# Patient Record
Sex: Female | Born: 1975 | Race: White | Hispanic: Yes | Marital: Single | Smoking: Never smoker
Health system: Southern US, Community
[De-identification: ages and names within clinical notes are randomized; demographics above are authoritative.]

## PROBLEM LIST (undated history)

## (undated) DIAGNOSIS — I10 Essential (primary) hypertension: Secondary | ICD-10-CM

## (undated) DIAGNOSIS — E7800 Pure hypercholesterolemia, unspecified: Secondary | ICD-10-CM

## (undated) HISTORY — PX: NO PAST SURGERIES: SHX2092

## (undated) HISTORY — DX: Essential (primary) hypertension: I10

---

## 2009-03-27 ENCOUNTER — Emergency Department (HOSPITAL_COMMUNITY): Admission: EM | Admit: 2009-03-27 | Discharge: 2009-03-27 | Payer: Self-pay | Admitting: Emergency Medicine

## 2010-07-03 LAB — PREGNANCY, URINE

## 2011-06-20 ENCOUNTER — Encounter (HOSPITAL_COMMUNITY): Payer: Self-pay | Admitting: Emergency Medicine

## 2011-06-20 ENCOUNTER — Emergency Department (HOSPITAL_COMMUNITY)
Admission: EM | Admit: 2011-06-20 | Discharge: 2011-06-20 | Disposition: A | Discharge status: Home / Self Care | Payer: Self-pay | Attending: Emergency Medicine | Admitting: Emergency Medicine

## 2011-06-20 DIAGNOSIS — J069 Acute upper respiratory infection, unspecified: Secondary | ICD-10-CM

## 2011-06-20 DIAGNOSIS — R51 Headache: Secondary | ICD-10-CM | POA: Insufficient documentation

## 2011-06-20 DIAGNOSIS — J029 Acute pharyngitis, unspecified: Secondary | ICD-10-CM | POA: Insufficient documentation

## 2011-06-20 DIAGNOSIS — IMO0001 Reserved for inherently not codable concepts without codable children: Secondary | ICD-10-CM | POA: Insufficient documentation

## 2011-06-20 NOTE — ED Provider Notes (Signed)
History     CSN: [id number]  Arrival date & time 06/20/11  1656   First MD Initiated Contact with Patient 06/20/11 1859      Chief Complaint  Patient presents with  . Nasal Congestion  . Cough    (Consider location/radiation/quality/duration/timing/severity/associated sxs/prior treatment) Patient is a 36 y.o. female presenting with cough. The history is provided by the patient. No language interpreter was used.  Cough This is a recurrent problem. The current episode started more than 1 week ago. The problem has been gradually worsening. The cough is non-productive. The maximum temperature recorded prior to her arrival was 100 to 100.9 F. The fever has been present for 5 days or more. Associated symptoms include chills, headaches, rhinorrhea, sore throat and myalgias. Pertinent negatives include no chest pain, no sweats, no ear congestion, no ear pain, no shortness of breath and no wheezing. She is not a smoker. Her past medical history does not include bronchitis, pneumonia, COPD, emphysema or asthma.   States that her and her husband have been coughing and congested with headaches for 3 weeks.  The kids have had sore throats and tested positive for strep. States has had intermittent low grade fevers with body aches. Denies n/v/d. Looks non-toxic. Will check rapid strep.  History reviewed. No pertinent past medical history.  History reviewed. No pertinent past surgical history.  No family history on file.  History  Substance Use Topics  . Smoking status: Never Smoker   . Smokeless tobacco: Not on file  . Alcohol Use:     OB History    Grav Para Term Preterm Abortions TAB SAB Ect Mult Living                  Review of Systems  Constitutional: Positive for chills.  HENT: Positive for sore throat and rhinorrhea. Negative for ear pain, neck pain and neck stiffness.   Respiratory: Positive for cough. Negative for shortness of breath and wheezing.   Cardiovascular: Negative for  chest pain.  Gastrointestinal: Negative for nausea, vomiting and diarrhea.  Musculoskeletal: Positive for myalgias.  Neurological: Positive for headaches.    Allergies  Review of patient's allergies indicates no known allergies.  Home Medications   Current Outpatient Rx  Name Route Sig Dispense Refill  . ACETAMINOPHEN 325 MG PO TABS Oral Take 650 mg by mouth every 6 (six) hours as needed.      BP 112/71  Pulse 72  Temp(Src) 98.5 F (36.9 C) (Oral)  Ht 5\' 4"  (1.626 m)  Wt 193 lb (87.544 kg)  BMI 33.13 kg/m2  SpO2 100%  Physical Exam  Nursing note and vitals reviewed. Constitutional: She is oriented to person, place, and time. She appears well-developed and well-nourished.  HENT:  Head: Normocephalic and atraumatic.  Right Ear: Tympanic membrane normal.  Left Ear: Tympanic membrane normal.  Nose: Mucosal edema and rhinorrhea present.  Mouth/Throat: Uvula is midline and mucous membranes are normal. Posterior oropharyngeal erythema present. No oropharyngeal exudate, posterior oropharyngeal edema or tonsillar abscesses.  Eyes: Conjunctivae and EOM are normal. Pupils are equal, round, and reactive to light.  Neck: Normal range of motion. Neck supple.  Cardiovascular: Normal rate.   Pulmonary/Chest: Effort normal and breath sounds normal. No respiratory distress. She has no wheezes.  Abdominal: Soft. There is no tenderness.  Musculoskeletal: Normal range of motion. She exhibits no edema and no tenderness.  Neurological: She is alert and oriented to person, place, and time. She has normal reflexes.  Skin: Skin is  warm and dry.  Psychiatric: She has a normal mood and affect.    ED Course  Procedures (including critical care time)   Labs Reviewed  RAPID STREP SCREEN   No results found.   No diagnosis found.    MDM  Upper respiratory symptoms x 3 weeks with sick contacts including strep throat.  Strep negative in the ER today.  No pcp return if worse.  Non toxic  appearance. Ibuprofen for body aches and fever.  Benadryl for nasal congestion.  Plan discussed with patient and ready for discharge.          Jethro Bastos, NP 06/22/11 1147

## 2011-06-20 NOTE — ED Notes (Signed)
Pt states that she and her husband have had nasal congestion and cough with headache for 3 weeks. States that their kids tested positive for strep not too long ago. Their throats hurt also.

## 2011-06-20 NOTE — Discharge Instructions (Signed)
Katherine Trujillo your lungs sound clear today. Your strep test was negative today. Try using the saline spray in your nose to make it feel better.  Take ibuprofen for pain.  Take benadryl at night to help you sleep and help with post nasal drip that makes you cough.  Return to the ER if you have uncontrolled fever nausea and vomiting. Followup with your PCP of your choice or get one from the list below to followup with.  RESOURCE GUIDE  Dental Problems  Patients with Medicaid: [facility] [phone]                                           [phone] [address] Phone:  [phone]                                                  Phone:  [phone]  If unable to pay or uninsured, contact:  [facility] or [facility]. to become qualified for the adult dental clinic.  Chronic Pain Problems Contact [facility]  [phone] Patients need to be referred by their primary care doctor.  Insufficient Money for Medicine Contact [employer]:  call "211" or [facility] 787-545-6237.  No Primary Care Doctor Call [facility]  [phone] Other agencies that provide inexpensive medical care    [facility]  [phone]    [facility]  [phone]    [facility]  [phone]    [facility]  [phone]    [facility]  [phone]    [facility]  [phone]  Psychological Services [facility]  [phone] [facility] Services  [phone] [facility]   [phone] (emergency services [phone])  Substance Abuse Resources [facility]  [phone] [facility] [phone] [facility] [phone] Floydene Flock [phone] Residential & Outpatient Substance Abuse Program  [phone]  Abuse/Neglect [facility] Child Abuse Hotline 606-326-3158 [facility] Child Abuse Hotline (504)736-7958 (After  Hours)  Emergency Shelter [facility] Ministries [phone]  Maternity Homes [facility] [phone] [facility] [phone]  MRSA Hotline #:   332-184-6355    [facility] Resources  [facility]     [employer]                          [facility] Dept. [address] [place]                       [address]      [address]  Patrecia Pace  Michell Heinrich Phone:  [phone]                                   Phone:  [phone]                 Phone:  [phone]  [facility] Mental Health Phone:  [phone]  [facility] Child Abuse Hotline [phone] (After Hours)   Antibiotic Nonuse  Your caregiver felt that the infection or problem was not one that would be helped with an antibiotic. Infections may be caused by viruses or bacteria. Only a caregiver can tell which one of these is the likely cause of an illness. A cold is the most common cause of infection in both adults and children. A cold is a virus. Antibiotic treatment will have no effect on a viral infection. Viruses can lead to many lost days of work caring for sick children and many missed days of school. Children may catch as many as 10 "colds" or "flus" per year during which they can be tearful, cranky, and uncomfortable. The goal of treating a virus is aimed at keeping the ill person comfortable. Antibiotics are medications used to help the body fight bacterial infections. There are relatively few types of bacteria that cause infections but there are hundreds of viruses. While both viruses and bacteria cause infection they are very different types of germs. A viral infection will typically go away by itself within 7 to 10 days. Bacterial infections may spread or get worse without antibiotic treatment. Examples of bacterial  infections are:  Sore throats (like strep throat or tonsillitis).   Infection in the lung (pneumonia).   Ear and skin infections.  Examples of viral infections are:  Colds or flus.   Most coughs and bronchitis.   Sore throats not caused by Strep.   Runny noses.  It is often best not to take an antibiotic when a viral infection is the cause of the problem. Antibiotics can kill off the helpful bacteria that we have inside our body and allow harmful bacteria to start growing. Antibiotics can cause side effects such as allergies, nausea, and diarrhea without helping to improve the symptoms of the viral infection. Additionally, repeated uses of antibiotics can cause bacteria inside of our body to become resistant. That resistance can be passed onto harmful bacterial. The next time you have an infection it may be harder to treat if antibiotics are used when they are not needed. Not treating with antibiotics allows our own immune system to develop and take care of infections more efficiently. Also, antibiotics will work better for [place] when they are prescribed for bacterial infections. Treatments for a child that is ill may include:  Give extra fluids throughout the day to stay hydrated.   Get plenty of rest.   Only give your child over-the-counter or prescription medicines for pain, discomfort, or fever as directed by your caregiver.   The use of a cool mist humidifier may help stuffy noses.   Cold medications if suggested by your caregiver.  Your caregiver may decide to start you on an antibiotic if:  The problem you were seen for today continues for a longer length of time than expected.   You develop a secondary bacterial infection.  SEEK MEDICAL CARE IF:  Fever lasts longer than 5 days.   Symptoms continue to get worse after 5 to 7 days or become severe.  Difficulty in breathing develops.   Signs of dehydration develop (poor drinking, rare urinating, dark colored urine).    Changes in behavior or worsening tiredness (listlessness or lethargy).  Document Released: 05/28/2001 Document Revised: 03/08/2011 Document Reviewed: 11/24/2008 [facility] Patient Information 2012 [place], [place].Antibiotic Nonuse  Your caregiver felt that the infection or problem was not one that would be helped with an antibiotic. Infections may be caused by viruses or bacteria. Only a caregiver can tell which one of these is the likely cause of an illness. A cold is the most common cause of infection in both adults and children. A cold is a virus. Antibiotic treatment will have no effect on a viral infection. Viruses can lead to many lost days of work caring for sick children and many missed days of school. Children may catch as many as 10 "colds" or "flus" per year during which they can be tearful, cranky, and uncomfortable. The goal of treating a virus is aimed at keeping the ill person comfortable. Antibiotics are medications used to help the body fight bacterial infections. There are relatively few types of bacteria that cause infections but there are hundreds of viruses. While both viruses and bacteria cause infection they are very different types of germs. A viral infection will typically go away by itself within 7 to 10 days. Bacterial infections may spread or get worse without antibiotic treatment. Examples of bacterial infections are:  Sore throats (like strep throat or tonsillitis).   Infection in the lung (pneumonia).   Ear and skin infections.  Examples of viral infections are:  Colds or flus.   Most coughs and bronchitis.   Sore throats not caused by Strep.   Runny noses.  It is often best not to take an antibiotic when a viral infection is the cause of the problem. Antibiotics can kill off the helpful bacteria that we have inside our body and allow harmful bacteria to start growing. Antibiotics can cause side effects such as allergies, nausea, and diarrhea without helping to  improve the symptoms of the viral infection. Additionally, repeated uses of antibiotics can cause bacteria inside of our body to become resistant. That resistance can be passed onto harmful bacterial. The next time you have an infection it may be harder to treat if antibiotics are used when they are not needed. Not treating with antibiotics allows our own immune system to develop and take care of infections more efficiently. Also, antibiotics will work better for [place] when they are prescribed for bacterial infections. Treatments for a child that is ill may include:  Give extra fluids throughout the day to stay hydrated.   Get plenty of rest.   Only give your child over-the-counter or prescription medicines for pain, discomfort, or fever as directed by your caregiver.   The use of a cool mist humidifier may help stuffy noses.   Cold medications if suggested by your caregiver.  Your caregiver may decide to start you on an antibiotic if:  The problem you were seen for today continues for a longer length of time than expected.   You develop a secondary bacterial infection.  SEEK MEDICAL CARE IF:  Fever lasts longer than 5 days.   Symptoms continue to get worse after 5 to 7 days or become severe.   Difficulty in breathing develops.   Signs of dehydration develop (poor drinking, rare urinating, dark colored urine).   Changes in behavior or worsening tiredness (listlessness or lethargy).  Document Released: 05/28/2001 Document Revised: 03/08/2011 Document Reviewed:  11/24/2008 [employer] Patient Information 2012 [place], [place].Antibiotic Nonuse  Your caregiver felt that the infection or problem was not one that would be helped with an antibiotic. Infections may be caused by viruses or bacteria. Only a caregiver can tell which one of these is the likely cause of an illness. A cold is the most common cause of infection in both adults and children. A cold is a virus. Antibiotic treatment will have  no effect on a viral infection. Viruses can lead to many lost days of work caring for sick children and many missed days of school. Children may catch as many as 10 "colds" or "flus" per year during which they can be tearful, cranky, and uncomfortable. The goal of treating a virus is aimed at keeping the ill person comfortable. Antibiotics are medications used to help the body fight bacterial infections. There are relatively few types of bacteria that cause infections but there are hundreds of viruses. While both viruses and bacteria cause infection they are very different types of germs. A viral infection will typically go away by itself within 7 to 10 days. Bacterial infections may spread or get worse without antibiotic treatment. Examples of bacterial infections are:  Sore throats (like strep throat or tonsillitis).   Infection in the lung (pneumonia).   Ear and skin infections.  Examples of viral infections are:  Colds or flus.   Most coughs and bronchitis.   Sore throats not caused by Strep.   Runny noses.  It is often best not to take an antibiotic when a viral infection is the cause of the problem. Antibiotics can kill off the helpful bacteria that we have inside our body and allow harmful bacteria to start growing. Antibiotics can cause side effects such as allergies, nausea, and diarrhea without helping to improve the symptoms of the viral infection. Additionally, repeated uses of antibiotics can cause bacteria inside of our body to become resistant. That resistance can be passed onto harmful bacterial. The next time you have an infection it may be harder to treat if antibiotics are used when they are not needed. Not treating with antibiotics allows our own immune system to develop and take care of infections more efficiently. Also, antibiotics will work better for [place] when they are prescribed for bacterial infections. Treatments for a child that is ill may include:  Give extra fluids  throughout the day to stay hydrated.   Get plenty of rest.   Only give your child over-the-counter or prescription medicines for pain, discomfort, or fever as directed by your caregiver.   The use of a cool mist humidifier may help stuffy noses.   Cold medications if suggested by your caregiver.  Your caregiver may decide to start you on an antibiotic if:  The problem you were seen for today continues for a longer length of time than expected.   You develop a secondary bacterial infection.  SEEK MEDICAL CARE IF:  Fever lasts longer than 5 days.   Symptoms continue to get worse after 5 to 7 days or become severe.   Difficulty in breathing develops.   Signs of dehydration develop (poor drinking, rare urinating, dark colored urine).   Changes in behavior or worsening tiredness (listlessness or lethargy).  Document Released: 05/28/2001 Document Revised: 03/08/2011 Document Reviewed: 11/24/2008 [facility] Patient Information 2012 [place], [place].Saline Nose Drops  To help clear a stuffy nose, put salt water (saline) nose drops in your infant's nose. This helps to loosen the secretions in the nose. Use a  bulb syringe to clean the nose out:  Before feeding.   Before putting your infant down for naps.   No more than once every 3 hours to avoid irritating your infant's nostrils.  HOME CARE  Buy nose drops at your local drug store. You can also make nose drops yourself. Mix 1 cup of water with  teaspoon of salt. Stir. Store this mixture at room temperature. Make a new batch daily.   To use the drops:   Put 1 or 2 drops in each side of infant's nose with a clean medicine dropper. Do not use this dropper for any other medicine.   Squeeze the air out of the suction bulb before inserting it into your infant's nose.   While still squeezing the bulb flat, place the tip of the bulb into a nostril. Let air come back into the bulb. The suction will pull snot out of the nose and into the bulb.    Repeat on other nostril.   Squeeze the bulb several times into a tissue and wash the bulb tip in soapy water. Store the bulb with the tip side down on paper towel.   Use the bulb syringe with only the saline drops to avoid irritating your infant's nostrils.  GET HELP RIGHT AWAY IF:  The snot changes to green or yellow.   The snot gets thicker.   Your infant is 3 months or younger with a rectal temperature of 100.4 F (38 C) or higher.   Your infant is older than 3 months with a rectal temperature of 102 F (38.9 C) or higher.   The stuffy nose lasts 10 days or longer.   There is trouble breathing or feeding.  MAKE SURE YOU:  Understand these instructions.   Will watch your infant's condition.   Will get help right away if your infant is not doing well or gets worse.  Document Released: 01/14/2009 Document Revised: 03/08/2011 Document Reviewed: 01/14/2009 [facility] Patient Information 2012 [place], [place].Saline Nose Drops  To help clear a stuffy nose, put salt water (saline) nose drops in your infant's nose. This helps to loosen the secretions in the nose. Use a bulb syringe to clean the nose out:  Before feeding.   Before putting your infant down for naps.   No more than once every 3 hours to avoid irritating your infant's nostrils.  HOME CARE  Buy nose drops at your local drug store. You can also make nose drops yourself. Mix 1 cup of water with  teaspoon of salt. Stir. Store this mixture at room temperature. Make a new batch daily.   To use the drops:   Put 1 or 2 drops in each side of infant's nose with a clean medicine dropper. Do not use this dropper for any other medicine.   Squeeze the air out of the suction bulb before inserting it into your infant's nose.   While still squeezing the bulb flat, place the tip of the bulb into a nostril. Let air come back into the bulb. The suction will pull snot out of the nose and into the bulb.   Repeat on other nostril.     Squeeze the bulb several times into a tissue and wash the bulb tip in soapy water. Store the bulb with the tip side down on paper towel.   Use the bulb syringe with only the saline drops to avoid irritating your infant's nostrils.  GET HELP RIGHT AWAY IF:  The snot changes to green or  yellow.   The snot gets thicker.   Your infant is 3 months or younger with a rectal temperature of 100.4 F (38 C) or higher.   Your infant is older than 3 months with a rectal temperature of 102 F (38.9 C) or higher.   The stuffy nose lasts 10 days or longer.   There is trouble breathing or feeding.  MAKE SURE YOU:  Understand these instructions.   Will watch your infant's condition.   Will get help right away if your infant is not doing well or gets worse.  Document Released: 01/14/2009 Document Revised: 03/08/2011 Document Reviewed: 01/14/2009 [facility] Patient Information 2012 [place], [place].Saline Nose Drops  To help clear a stuffy nose, put salt water (saline) nose drops in your infant's nose. This helps to loosen the secretions in the nose. Use a bulb syringe to clean the nose out:  Before feeding.   Before putting your infant down for naps.   No more than once every 3 hours to avoid irritating your infant's nostrils.  HOME CARE  Buy nose drops at your local drug store. You can also make nose drops yourself. Mix 1 cup of water with  teaspoon of salt. Stir. Store this mixture at room temperature. Make a new batch daily.   To use the drops:   Put 1 or 2 drops in each side of infant's nose with a clean medicine dropper. Do not use this dropper for any other medicine.   Squeeze the air out of the suction bulb before inserting it into your infant's nose.   While still squeezing the bulb flat, place the tip of the bulb into a nostril. Let air come back into the bulb. The suction will pull snot out of the nose and into the bulb.   Repeat on other nostril.   Squeeze the bulb several  times into a tissue and wash the bulb tip in soapy water. Store the bulb with the tip side down on paper towel.   Use the bulb syringe with only the saline drops to avoid irritating your infant's nostrils.  GET HELP RIGHT AWAY IF:  The snot changes to green or yellow.   The snot gets thicker.   Your infant is 3 months or younger with a rectal temperature of 100.4 F (38 C) or higher.   Your infant is older than 3 months with a rectal temperature of 102 F (38.9 C) or higher.   The stuffy nose lasts 10 days or longer.   There is trouble breathing or feeding.  MAKE SURE YOU:  Understand these instructions.   Will watch your infant's condition.   Will get help right away if your infant is not doing well or gets worse.  Document Released: 01/14/2009 Document Revised: 03/08/2011 Document Reviewed: 01/14/2009 [facility] Patient Information 2012 [place], [place].Saline Nose Drops  To help clear a stuffy nose, put salt water (saline) nose drops in your infant's nose. This helps to loosen the secretions in the nose. Use a bulb syringe to clean the nose out:  Before feeding.   Before putting your infant down for naps.   No more than once every 3 hours to avoid irritating your infant's nostrils.  HOME CARE  Buy nose drops at your local drug store. You can also make nose drops yourself. Mix 1 cup of water with  teaspoon of salt. Stir. Store this mixture at room temperature. Make a new batch daily.   To use the drops:   Put 1 or  2 drops in each side of infant's nose with a clean medicine dropper. Do not use this dropper for any other medicine.   Squeeze the air out of the suction bulb before inserting it into your infant's nose.   While still squeezing the bulb flat, place the tip of the bulb into a nostril. Let air come back into the bulb. The suction will pull snot out of the nose and into the bulb.   Repeat on other nostril.   Squeeze the bulb several times into a tissue and wash  the bulb tip in soapy water. Store the bulb with the tip side down on paper towel.   Use the bulb syringe with only the saline drops to avoid irritating your infant's nostrils.  GET HELP RIGHT AWAY IF:  The snot changes to green or yellow.   The snot gets thicker.   Your infant is 3 months or younger with a rectal temperature of 100.4 F (38 C) or higher.   Your infant is older than 3 months with a rectal temperature of 102 F (38.9 C) or higher.   The stuffy nose lasts 10 days or longer.   There is trouble breathing or feeding.  MAKE SURE YOU:  Understand these instructions.   Will watch your infant's condition.   Will get help right away if your infant is not doing well or gets worse.  Document Released: 01/14/2009 Document Revised: 03/08/2011 Document Reviewed: 01/14/2009 [facility] Patient Information 2012 [place], [place].

## 2011-06-25 NOTE — ED Provider Notes (Signed)
Medical screening examination/treatment/procedure(s) were performed by non-physician practitioner and as supervising physician I was immediately available for consultation/collaboration.  Raidon Swanner, MD 06/25/11 0716 

## 2011-07-13 ENCOUNTER — Encounter (HOSPITAL_COMMUNITY): Payer: Self-pay | Admitting: Emergency Medicine

## 2011-07-13 ENCOUNTER — Emergency Department (HOSPITAL_COMMUNITY): Payer: Self-pay

## 2011-07-13 ENCOUNTER — Emergency Department (HOSPITAL_COMMUNITY)
Admission: EM | Admit: 2011-07-13 | Discharge: 2011-07-13 | Disposition: A | Discharge status: Home / Self Care | Payer: Self-pay | Attending: Emergency Medicine | Admitting: Emergency Medicine

## 2011-07-13 DIAGNOSIS — J189 Pneumonia, unspecified organism: Secondary | ICD-10-CM | POA: Insufficient documentation

## 2011-07-13 DIAGNOSIS — R059 Cough, unspecified: Secondary | ICD-10-CM | POA: Insufficient documentation

## 2011-07-13 DIAGNOSIS — R05 Cough: Secondary | ICD-10-CM | POA: Insufficient documentation

## 2011-07-13 DIAGNOSIS — R509 Fever, unspecified: Secondary | ICD-10-CM | POA: Insufficient documentation

## 2011-07-13 DIAGNOSIS — R0602 Shortness of breath: Secondary | ICD-10-CM | POA: Insufficient documentation

## 2011-07-13 MED ORDER — MOXIFLOXACIN HCL IN NACL 400 MG/250ML IV SOLN
400.0000 mg | Freq: Once | INTRAVENOUS | Status: AC
  Administered 2011-07-13: 400 mg via INTRAVENOUS
  Filled 2011-07-13: qty 250

## 2011-07-13 MED ORDER — ALBUTEROL SULFATE HFA 108 (90 BASE) MCG/ACT IN AERS
2.0000 | INHALATION_SPRAY | RESPIRATORY_TRACT | Status: DC | PRN
  Filled 2011-07-13: qty 6.7

## 2011-07-13 MED ORDER — AZITHROMYCIN 250 MG PO TABS: 250.0000 mg | ORAL_TABLET | Freq: Every day | ORAL | Status: AC

## 2011-07-13 MED ORDER — IBUPROFEN 800 MG PO TABS
ORAL_TABLET | ORAL | Status: AC
  Administered 2011-07-13: 800 mg via ORAL
  Filled 2011-07-13: qty 1

## 2011-07-13 MED ORDER — IBUPROFEN 800 MG PO TABS
800.0000 mg | ORAL_TABLET | Freq: Once | ORAL | Status: AC
  Administered 2011-07-13: 800 mg via ORAL

## 2011-07-13 MED ORDER — AZITHROMYCIN 250 MG PO TABS
500.0000 mg | ORAL_TABLET | Freq: Once | ORAL | Status: AC
  Administered 2011-07-13: 500 mg via ORAL
  Filled 2011-07-13: qty 2

## 2011-07-13 MED ORDER — ACETAMINOPHEN 325 MG PO TABS
650.0000 mg | ORAL_TABLET | Freq: Once | ORAL | Status: AC
  Administered 2011-07-13: 650 mg via ORAL
  Filled 2011-07-13: qty 2

## 2011-07-13 MED ORDER — ACETAMINOPHEN-CODEINE 120-12 MG/5ML PO SUSP: 5.0000 mL | Freq: Four times a day (QID) | ORAL | Status: AC | PRN

## 2011-07-13 NOTE — ED Provider Notes (Signed)
History     CSN: [id number]  Arrival date & time 07/13/11  0044   First MD Initiated Contact with Patient 07/13/11 0256      Chief Complaint  Patient presents with  . Cough  . Shortness of Breath    (Consider location/radiation/quality/duration/timing/severity/associated sxs/prior treatment) HPI Persistent cough over the last few weeks, now with fever tonight. Mild productive sputum. No hemoptysis. No leg pain or leg swelling. No nausea vomiting or diarrhea. No known sick contacts. No headaches or neck pain or stiffness. No sore throat. Moderate in severity. No pain or radiation  History reviewed. No pertinent past medical history.  History reviewed. No pertinent past surgical history.  No family history on file.  History  Substance Use Topics  . Smoking status: Never Smoker   . Smokeless tobacco: Not on file  . Alcohol Use:     OB History    Grav Para Term Preterm Abortions TAB SAB Ect Mult Living                  Review of Systems  Constitutional: Positive for fever and chills.  HENT: Negative for neck pain and neck stiffness.   Eyes: Negative for pain.  Respiratory: Positive for cough. Negative for wheezing.   Cardiovascular: Negative for chest pain.  Gastrointestinal: Negative for abdominal pain.  Genitourinary: Negative for dysuria.  Musculoskeletal: Negative for back pain.  Skin: Negative for rash.  Neurological: Negative for headaches.  All other systems reviewed and are negative.    Allergies  Review of patient's allergies indicates no known allergies.  Home Medications   Current Outpatient Rx  Name Route Sig Dispense Refill  . ACETAMINOPHEN 325 MG PO TABS Oral Take 650 mg by mouth every 6 (six) hours as needed.      BP 112/80  Pulse 93  Temp(Src) 102.6 F (39.2 C) (Oral)  Resp 16  Wt 193 lb (87.544 kg)  SpO2 99%  LMP 07/13/2011  Physical Exam  Constitutional: She is oriented to person, place, and time. She appears well-developed and  well-nourished.  HENT:  Head: Normocephalic and atraumatic.  Eyes: Conjunctivae and EOM are normal. Pupils are equal, round, and reactive to light.  Neck: Trachea normal. Neck supple. No thyromegaly present.  Cardiovascular: Normal rate, regular rhythm, S1 normal, S2 normal and normal pulses.     No systolic murmur is present   No diastolic murmur is present  Pulses:      Radial pulses are 2+ on the right side, and 2+ on the left side.  Pulmonary/Chest: She has no wheezes. She has no rhonchi. She has no rales. She exhibits no tenderness.       Mildly coarse bilateral breath sounds without tachypnea  Abdominal: Soft. Normal appearance and bowel sounds are normal. There is no tenderness. There is no CVA tenderness and negative Murphy's sign.  Musculoskeletal:       BLE:s Calves nontender, no cords or erythema, negative Homans sign  Neurological: She is alert and oriented to person, place, and time. She has normal strength. No cranial nerve deficit or sensory deficit. GCS eye subscore is 4. GCS verbal subscore is 5. GCS motor subscore is 6.  Skin: Skin is warm and dry. No rash noted. She is not diaphoretic.  Psychiatric: Her speech is normal.       Cooperative and appropriate    ED Course  Procedures (including critical care time)  Labs Reviewed - No data to display Dg Chest 2 View  07/13/2011  *  RADIOLOGY REPORT*  Clinical Data: Cough and fever for 3 days.  CHEST - 2 VIEW  Comparison: None.  Findings: Right middle lobe and possible medial segment left lower lobe infiltrate. Recommend follow-up until clearance.  Increased interstitial markings/pulmonary vascular congestion.  Heart size within normal limits.  No pneumothorax.  IMPRESSION: Right middle lobe and possible medial segment left lower lobe infiltrate. Recommend follow-up until clearance.  Increased interstitial markings/pulmonary vascular congestion.  Original Report Authenticated By: Fuller Canada, M.D.     MDM   Cough and  fever with pneumonia on x-ray. Treated with antibiotics. First dose given IV and prescription provided. Reliable historian and agrees to all discharge and followup instructions and strict return precautions for any worsening condition. No hypoxia or respiratory distress. Otherwise healthy adult stable for discharge home.         Sunnie Nielsen, MD 07/13/11 [phone]

## 2011-07-13 NOTE — ED Notes (Signed)
Pt alert, nad, c/o cough and congestion, onset a few days ago, recent exposure to ill child, resp even unlabored, npc noted

## 2011-07-13 NOTE — Discharge Instructions (Signed)

## 2013-01-19 ENCOUNTER — Emergency Department (HOSPITAL_COMMUNITY): Payer: Self-pay

## 2013-01-19 ENCOUNTER — Encounter (HOSPITAL_COMMUNITY): Payer: Self-pay | Admitting: Emergency Medicine

## 2013-01-19 ENCOUNTER — Emergency Department (HOSPITAL_COMMUNITY)
Admission: EM | Admit: 2013-01-19 | Discharge: 2013-01-19 | Disposition: A | Discharge status: Home / Self Care | Payer: Self-pay | Attending: Emergency Medicine | Admitting: Emergency Medicine

## 2013-01-19 DIAGNOSIS — J3489 Other specified disorders of nose and nasal sinuses: Secondary | ICD-10-CM | POA: Insufficient documentation

## 2013-01-19 DIAGNOSIS — N898 Other specified noninflammatory disorders of vagina: Secondary | ICD-10-CM | POA: Insufficient documentation

## 2013-01-19 DIAGNOSIS — R5381 Other malaise: Secondary | ICD-10-CM | POA: Insufficient documentation

## 2013-01-19 DIAGNOSIS — R51 Headache: Secondary | ICD-10-CM | POA: Insufficient documentation

## 2013-01-19 DIAGNOSIS — R109 Unspecified abdominal pain: Secondary | ICD-10-CM | POA: Insufficient documentation

## 2013-01-19 DIAGNOSIS — J029 Acute pharyngitis, unspecified: Secondary | ICD-10-CM | POA: Insufficient documentation

## 2013-01-19 DIAGNOSIS — R52 Pain, unspecified: Secondary | ICD-10-CM | POA: Insufficient documentation

## 2013-01-19 DIAGNOSIS — Z3202 Encounter for pregnancy test, result negative: Secondary | ICD-10-CM | POA: Insufficient documentation

## 2013-01-19 DIAGNOSIS — R110 Nausea: Secondary | ICD-10-CM | POA: Insufficient documentation

## 2013-01-19 DIAGNOSIS — R509 Fever, unspecified: Secondary | ICD-10-CM | POA: Insufficient documentation

## 2013-01-19 LAB — URINALYSIS, ROUTINE W REFLEX MICROSCOPIC
Bilirubin Urine: NEGATIVE
Ketones, ur: NEGATIVE mg/dL
Nitrite: NEGATIVE
Protein, ur: NEGATIVE mg/dL
Urobilinogen, UA: 1 mg/dL (ref 0.0–1.0)

## 2013-01-19 LAB — COMPREHENSIVE METABOLIC PANEL
ALT: 12 U/L (ref 0–35)
Alkaline Phosphatase: 69 U/L (ref 39–117)
BUN: 17 mg/dL (ref 6–23)
CO2: 24 mEq/L (ref 19–32)
GFR calc Af Amer: >90 mL/min (ref >90)
GFR calc non Af Amer: >90 mL/min (ref >90)
Glucose, Bld: 127 mg/dL — ABNORMAL HIGH (ref 70–99)
Potassium: 3.6 mEq/L (ref 3.5–5.1)
Sodium: 135 mEq/L (ref 135–145)
Total Bilirubin: 0.2 mg/dL — ABNORMAL LOW (ref 0.3–1.2)

## 2013-01-19 LAB — CBC WITH DIFFERENTIAL/PLATELET
Hemoglobin: 15 g/dL (ref 12.0–15.0)
Lymphocytes Relative: 14 % (ref 12–46)
Lymphs Abs: 1 10*3/uL (ref 0.7–4.0)
MCH: 30.9 pg (ref 26.0–34.0)
MCV: 87.7 fL (ref 78.0–100.0)
Monocytes Relative: 8 % (ref 3–12)
Neutrophils Relative %: 75 % (ref 43–77)
Platelets: 213 10*3/uL (ref 150–400)
RBC: 4.86 MIL/uL (ref 3.87–5.11)
WBC: 7.5 10*3/uL (ref 4.0–10.5)

## 2013-01-19 LAB — URINE MICROSCOPIC-ADD ON

## 2013-01-19 MED ORDER — KETOROLAC TROMETHAMINE 30 MG/ML IJ SOLN
30.0000 mg | Freq: Once | INTRAMUSCULAR | Status: AC
  Administered 2013-01-19: 30 mg via INTRAVENOUS
  Filled 2013-01-19: qty 1

## 2013-01-19 MED ORDER — OXYMETAZOLINE HCL 0.05 % NA SOLN: 2.0000 | Freq: Two times a day (BID) | NASAL | Status: DC

## 2013-01-19 MED ORDER — METOCLOPRAMIDE HCL 5 MG/ML IJ SOLN
10.0000 mg | Freq: Once | INTRAMUSCULAR | Status: AC
  Administered 2013-01-19: 10 mg via INTRAVENOUS
  Filled 2013-01-19: qty 2

## 2013-01-19 MED ORDER — ACETAMINOPHEN 325 MG PO TABS
650.0000 mg | ORAL_TABLET | Freq: Once | ORAL | Status: AC
  Administered 2013-01-19: 650 mg via ORAL
  Filled 2013-01-19: qty 2

## 2013-01-19 MED ORDER — SODIUM CHLORIDE 0.9 % IV BOLUS (SEPSIS)
1000.0000 mL | Freq: Once | INTRAVENOUS | Status: AC
  Administered 2013-01-19: 1000 mL via INTRAVENOUS

## 2013-01-19 MED ORDER — METOCLOPRAMIDE HCL 10 MG PO TABS: 10.0000 mg | ORAL_TABLET | Freq: Four times a day (QID) | ORAL | Status: DC | PRN

## 2013-01-19 MED ORDER — IBUPROFEN 600 MG PO TABS: 600.0000 mg | ORAL_TABLET | Freq: Four times a day (QID) | ORAL | Status: DC | PRN

## 2013-01-19 MED ORDER — MOMETASONE FUROATE 50 MCG/ACT NA SUSP: 2.0000 | Freq: Every day | NASAL | Status: DC

## 2013-01-19 NOTE — ED Provider Notes (Signed)
CSN: [id number]     Arrival date & time 01/19/13  0157 History   First MD Initiated Contact with Patient 01/19/13 [phone]     Chief Complaint  Patient presents with  . Generalized Body Aches  . Headache   (Consider location/radiation/quality/duration/timing/severity/associated sxs/prior Treatment) HPI History presents with 2-3 days of gradual onset frontal headache, nasal congestion, abdominal pain, nausea and low-grade fever with chills. Patient also is having diffuse myalgias and arthralgias and admits to mild cough without sputum production. She has no neck pain or stiffness. Just mild sore throat. Patient states she has 2 young children but had similar respiratory illness this several weeks ago. She's had no recent travel. Abdominal pain does not to be associated with food. She's never had any abdominal surgeries. States she is currently on her menstrual cycle. Abdominal pain is in the right upper quadrant and seems to radiate around to her right flank. She denies any., Dysuria, frequency or other urinary symptoms. History reviewed. No pertinent past medical history. History reviewed. No pertinent past surgical history. No family history on file. History  Substance Use Topics  . Smoking status: Never Smoker   . Smokeless tobacco: Not on file  . Alcohol Use: No   OB History   Grav Para Term Preterm Abortions TAB SAB Ect Mult Living                 Review of Systems  Constitutional: Positive for fever, chills and fatigue.  HENT: Positive for congestion, sinus pressure and sore throat. Negative for trouble swallowing.   Respiratory: Positive for cough. Negative for shortness of breath.   Cardiovascular: Negative for chest pain, palpitations and leg swelling.  Gastrointestinal: Positive for nausea and abdominal pain. Negative for vomiting, diarrhea and blood in stool.  Genitourinary: Positive for flank pain and vaginal bleeding. Negative for dysuria, frequency, hematuria, vaginal  discharge, difficulty urinating, vaginal pain and pelvic pain.  Musculoskeletal: Positive for back pain and myalgias. Negative for neck pain and neck stiffness.  Skin: Negative for rash and wound.  Neurological: Positive for headaches. Negative for dizziness, weakness, light-headedness and numbness.  All other systems reviewed and are negative.    Allergies  Review of patient's allergies indicates no known allergies.  Home Medications   Current Outpatient Rx  Name  Route  Sig  Dispense  Refill  . acetaminophen (TYLENOL) 325 MG tablet   Oral   Take 650 mg by mouth every 6 (six) hours as needed.          BP 118/88  Pulse 94  Temp(Src) 100.4 F (38 C) (Oral)  Resp 15  Ht 5\' 5"  (1.651 m)  Wt 185 lb (83.915 kg)  BMI 30.79 kg/m2  SpO2 96%  LMP 01/18/2013 Physical Exam  Nursing note and vitals reviewed. Constitutional: She is oriented to person, place, and time. She appears well-developed and well-nourished. No distress.  HENT:  Head: Normocephalic and atraumatic.  Mouth/Throat: Oropharynx is clear and moist. No oropharyngeal exudate.  Bilateral nasal mucosa edema. Patient has tenderness to percussion over bilateral maxillary sinuses.  Eyes: EOM are normal. Pupils are equal, round, and reactive to light.  Neck: Normal range of motion. Neck supple.  No meningismus  Cardiovascular: Normal rate and regular rhythm.   Pulmonary/Chest: Effort normal and breath sounds normal. No respiratory distress. She has no wheezes. She has no rales.  Abdominal: Soft. Bowel sounds are normal. She exhibits no distension and no mass. There is tenderness (tenderness to palpation the right upper quadrant.).  There is no rebound and no guarding.  Musculoskeletal: Normal range of motion. She exhibits no edema and no tenderness.  Mild right flank tenderness to percussion.  Neurological: She is alert and oriented to person, place, and time.  Moves all extremities without deficit. Sensation grossly  intact.  Skin: Skin is warm and dry. No rash noted. No erythema.  Psychiatric: She has a normal mood and affect. Her behavior is normal.    ED Course  Procedures (including critical care time) Labs Review Labs Reviewed  CBC WITH DIFFERENTIAL  COMPREHENSIVE METABOLIC PANEL  URINALYSIS, ROUTINE W REFLEX MICROSCOPIC  PREGNANCY, URINE  LIPASE, BLOOD   Imaging Review No results found.  EKG Interpretation   None       MDM  Question URI versus intra-abdominal infectious process.  Vision feeling much better after IV fluids and pain medication. I suspect she has an influenza-like illness. There is no evidence for bacterial infection. Abdominal ultrasound is normal. Her headache I believe is related to her viral infection. She has no concerning symptoms or signs of meningitis, encephalitis or subarachnoid hemorrhage. We'll treat symptomatically and advised plenty of fluids and rest. Return precautions have been given.  Loren Racer, MD 01/19/13 [phone]

## 2013-01-19 NOTE — ED Notes (Addendum)
Pt states she hurts all over, started 2 days ago with headache, then body aches and chills, been taking OTC meds without relief, nausea without vomiting

## 2015-07-11 ENCOUNTER — Emergency Department (HOSPITAL_COMMUNITY)
Admission: EM | Admit: 2015-07-11 | Discharge: 2015-07-11 | Disposition: A | Discharge status: Home / Self Care | Payer: Self-pay | Attending: Emergency Medicine | Admitting: Emergency Medicine

## 2015-07-11 ENCOUNTER — Encounter (HOSPITAL_COMMUNITY): Payer: Self-pay | Admitting: Emergency Medicine

## 2015-07-11 ENCOUNTER — Emergency Department (HOSPITAL_BASED_OUTPATIENT_CLINIC_OR_DEPARTMENT_OTHER): Admit: 2015-07-11 | Discharge: 2015-07-11 | Disposition: A | Discharge status: Home / Self Care | Payer: Self-pay

## 2015-07-11 DIAGNOSIS — M25551 Pain in right hip: Secondary | ICD-10-CM | POA: Insufficient documentation

## 2015-07-11 DIAGNOSIS — Z7951 Long term (current) use of inhaled steroids: Secondary | ICD-10-CM | POA: Insufficient documentation

## 2015-07-11 DIAGNOSIS — M79609 Pain in unspecified limb: Secondary | ICD-10-CM

## 2015-07-11 DIAGNOSIS — M79661 Pain in right lower leg: Secondary | ICD-10-CM | POA: Insufficient documentation

## 2015-07-11 DIAGNOSIS — M79604 Pain in right leg: Secondary | ICD-10-CM

## 2015-07-11 DIAGNOSIS — Z79899 Other long term (current) drug therapy: Secondary | ICD-10-CM | POA: Insufficient documentation

## 2015-07-11 MED ORDER — KETOROLAC TROMETHAMINE 30 MG/ML IJ SOLN
30.0000 mg | Freq: Once | INTRAMUSCULAR | Status: AC
  Administered 2015-07-11: 30 mg via INTRAMUSCULAR
  Filled 2015-07-11 (×2): qty 1

## 2015-07-11 MED ORDER — NAPROXEN 500 MG PO TABS: 500.0000 mg | ORAL_TABLET | Freq: Two times a day (BID) | ORAL | Status: DC

## 2015-07-11 NOTE — ED Notes (Signed)
RN contacted US.  [facility] [place].

## 2015-07-11 NOTE — Progress Notes (Signed)
*  Preliminary Results* Right lower extremity venous duplex completed. Right lower extremity is negative for deep vein thrombosis. There is no evidence of right Baker's cyst.  07/11/2015 6:57 PM  Gertie FeyMichelle Mackinze Criado, RVT, RDCS, RDMS

## 2015-07-11 NOTE — Discharge Instructions (Signed)
Your ultrasound was negative for a blood clot. You likely have musculoskeletal pain. Take naproxen as needed for pain.  Take medications as prescribed. Return to the emergency room for worsening condition or new concerning symptoms. Follow up with your regular doctor. If you don't have a regular doctor use one of the numbers below to establish a primary care doctor.   Emergency Department Resource Guide 1) Find a Doctor and Pay Out of Pocket Although you won't have to find out who is covered by your insurance plan, it is a good idea to ask around and get recommendations. You will then need to call the office and see if the doctor you have chosen will accept you as a new patient and what types of options they offer for patients who are self-pay. Some doctors offer discounts or will set up payment plans for their patients who do not have insurance, but you will need to ask so you aren't surprised when you get to your appointment.  2) Contact Your Local Health Department Not all health departments have doctors that can see patients for sick visits, but many do, so it is worth a call to see if yours does. If you don't know where your local health department is, you can check in your phone book. The CDC also has a tool to help you locate your state's health department, and many state websites also have listings of all of their local health departments.  3) Find a Walk-in Clinic If your illness is not likely to be very severe or complicated, you may want to try a walk in clinic. These are popping up all over the country in pharmacies, drugstores, and shopping centers. They're usually staffed by nurse practitioners or physician assistants that have been trained to treat common illnesses and complaints. They're usually fairly quick and inexpensive. However, if you have serious medical issues or chronic medical problems, these are probably not your best option.  No Primary Care Doctor: - Call Health Connect  at  [phone] - they can help you locate a primary care doctor that  accepts your insurance, provides certain services, etc. - Physician Referral [phone]  Emergency Department Resource Guide 1) Find a Doctor and Pay Out of Pocket Although you won't have to find out who is covered by your insurance plan, it is a good idea to ask around and get recommendations. You will then need to call the office and see if the doctor you have chosen will accept you as a new patient and what types of options they offer for patients who are self-pay. Some doctors offer discounts or will set up payment plans for their patients who do not have insurance, but you will need to ask so you aren't surprised when you get to your appointment.  2) Contact Your Local Health Department Not all health departments have doctors that can see patients for sick visits, but many do, so it is worth a call to see if yours does. If you don't know where your local health department is, you can check in your phone book. The CDC also has a tool to help you locate your state's health department, and many state websites also have listings of all of their local health departments.  3) Find a Walk-in Clinic If your illness is not likely to be very severe or complicated, you may want to try a walk in clinic. These are popping up all over the country in pharmacies, drugstores, and shopping centers. They're usually  staffed by nurse practitioners or physician assistants that have been trained to treat common illnesses and complaints. They're usually fairly quick and inexpensive. However, if you have serious medical issues or chronic medical problems, these are probably not your best option.  No Primary Care Doctor: - Call Health Connect at  [phone] - they can help you locate a primary care doctor that  accepts your insurance, provides certain services, etc. - Physician Referral Service- [phone]  Chronic Pain  Problems: Organization         Address  Phone   Notes  [facility]  [phone] Patients need to be referred by their primary care doctor.   Medication Assistance: Organization         Address  Phone   Notes  [facility] [place] Medication [facility] [address] [place], [place] [address] [phone] --Must be a resident of [place] -- Must have NO insurance coverage whatsoever (no Medicaid/ Medicare, etc.) -- The pt. MUST have a primary care doctor that directs their care regularly and follows them in the community   [facility]  [phone]   [employer]  [phone]    Agencies that provide inexpensive medical care: Organization         Address  Phone   Notes  [facility]  [phone]) [phone]   [facility]    [phone]) [phone]   [facility] [address] [place], [place] [address] [phone]) [phone]   [facility] [address], [place] [phone]) [phone]   [facility]    [phone]   [facility]    [phone]   [facility]  [address], [place] Phone:  [phone], Fax:  [phone] Hours of Operation:  9 am - 6 pm, M-F.  Also accepts Medicaid/Medicare and self-pay.  [facility]  [address], [place] Phone: [phone], Fax: [phone]. Hours of Operation:  8:30 am - 5:30 pm, M-F.  Also accepts Medicaid and self-pay.  [facility] [place] [address], [place] Phone: [phone]) [phone]   [facility] [address], [place], [place] [phone], Ext. [phone] Mondays & Thursdays: 7-9 AM.  First 15 patients are seen on a first come, first serve basis.    Medicaid-accepting [facility] Providers:  Organization         Address  Phone   Notes  [facility] [address], [place] [phone] Also  accepts self-pay patients.  [facility] [address], [place]  [phone]) [phone]   [facility] [address], [place] [phone]   [facility] [address] [place] [address], [place] [phone]) [phone]   [facility] [address], [place]   [phone] Only accepts [place] patients after they have their name applied to their card.   Self-Pay (no insurance) in [facility] [place]:  Organization         Address  Phone   Notes  Sickle Cell Patients, [facility] [address], [place] [phone]   [facility] [address], [place] [phone]   [facility]  [address], [place] [phone]   [facility]/Dr. Osei-Bonsu  [address], [place] or [address], [place] [phone] Phone number for both [place] and [place] locations is the same.  [facility] [address], [place] [phone]   [facility] [address] [place] or [address] [phone]   [facility] [address] [place] [phone], phone; [phone], fax Sees patients 1st and 3rd Saturday of every month.  Must not qualify for public or private insurance (i.e. Medicaid, Medicare, [place] Health Choice, Veterans' Benefits)  Household income should be no more than 200% of the poverty level The clinic cannot treat you if you are pregnant or think you are pregnant  Sexually transmitted diseases are not treated at the clinic.     Musculoskeletal Pain Musculoskeletal pain is muscle and boney aches and pains. These pains can occur in any part of the body. Your caregiver may treat you without knowing the cause of the pain. They may treat you if blood or urine tests, X-rays, and other tests  were normal.  CAUSES There is often not a definite cause or reason for these pains. These pains may be caused by a type of germ (virus). The discomfort may also come from overuse. Overuse includes working out too hard when your body is not fit. Boney aches also come from weather changes. Bone is sensitive to atmospheric pressure changes. HOME CARE INSTRUCTIONS   Ask when your test results will be ready. Make sure you get your test results.  Only take over-the-counter or prescription medicines for pain, discomfort, or fever as directed by your caregiver. If you were given medications for your condition, do not drive, operate machinery or power tools, or sign legal documents for 24 hours. Do not drink alcohol. Do not take sleeping pills or other medications that may interfere with treatment.  Continue all activities unless the activities cause more pain. When the pain lessens, slowly resume normal activities. Gradually increase the intensity and duration of the activities or exercise.  During periods of severe pain, bed rest may be helpful. Lay or sit in any position that is comfortable.  Putting ice on the injured area.  Put ice in a bag.  Place a towel between your skin and the bag.  Leave the ice on for 15 to 20 minutes, 3 to 4 times a day.  Follow up with your caregiver for continued problems and no reason can be found for the pain. If the pain becomes worse or does not go away, it may be necessary to repeat tests or do additional testing. Your caregiver may need to look further for a possible cause. SEEK IMMEDIATE MEDICAL CARE IF:  You have pain that is getting worse and is not relieved by medications.  You develop chest pain that is associated with shortness or breath, sweating, feeling sick to your stomach (nauseous), or throw up (vomit).  Your pain becomes localized to the abdomen.  You develop any new symptoms that seem different or that concern you. MAKE SURE YOU:    Understand these instructions.  Will watch your condition.  Will get help right away if you are not doing well or get worse.   This information is not intended to replace advice given to you by your health care provider. Make sure you discuss any questions you have with your health care provider.   Document Released: 03/19/2005 Document Revised: 06/11/2011 Document Reviewed: 11/21/2012  Elsevier Interactive Patient Education ©2016 Elsevier Inc. ° °

## 2015-07-11 NOTE — ED Notes (Signed)
US bedside

## 2015-07-11 NOTE — ED Notes (Signed)
Pt states she has had right leg pain x 4 weeks. Pt states the pain is worse when she moves her leg or when she walks or bends her leg. Pt states the back of her leg is painful. Pt is able to ambulate without assistance. Pt denies injury. There is no obvious swelling, redness, or deformity.

## 2015-07-11 NOTE — ED Provider Notes (Signed)
CSN: [id number]     Arrival date & time 07/11/15  1520 History  By signing my name below, I, Placido Sou, attest that this documentation has been prepared under the direction and in the presence of Ellanie Oppedisano Y Bahar Shelden, [place]. Electronically Signed: Placido Sou, ED Scribe. 07/11/2015. 4:14 PM.   Chief Complaint  Patient presents with  . Leg Pain   The history is provided by the patient. No language interpreter was used.   HPI Comments: Charnese Federici is a 40 y.o. female who is otherwise healthy presents to the Emergency Department complaining of worsening, moderate, atraumatic, right thigh, calf and hip pain x 4 weeks. Pt notes associated, mild, weakness of her right leg and worsening pain with movement. She reports taking tylenol, Advil and motrin for pain management without significant relief of her pain. Pt works in [employer] and stands, ambulates and lifts heavy objects frequently. She denies a hx of smoking or a PMHx of DVT/PE. Pt denies any recent travel. She denies any other associated symptoms at this time. Denies chest pain or SOB.    History reviewed. No pertinent past medical history. History reviewed. No pertinent past surgical history. No family history on file. Social History  Substance Use Topics  . Smoking status: Never Smoker   . Smokeless tobacco: None  . Alcohol Use: No   OB History    No data available     Review of Systems A complete 10 system review of systems was obtained and all systems are negative except as noted in the HPI and PMH.   Allergies  Review of patient's allergies indicates no known allergies.  Home Medications   Prior to Admission medications   Medication Sig Start Date End Date Taking? Authorizing Provider  acetaminophen (TYLENOL) 325 MG tablet Take 650 mg by mouth every 6 (six) hours as needed for pain.     Historical Provider, MD  Chlorphen-Phenyleph-ASA (ALKA-SELTZER PLUS COLD PO) Take 1 tablet by mouth every 6 (six) hours as needed (headache  or pain).    Historical Provider, MD  ibuprofen (ADVIL,MOTRIN) 600 MG tablet Take 1 tablet (600 mg total) by mouth every 6 (six) hours as needed for pain. 01/19/13   Loren Racer, MD  metoCLOPramide (REGLAN) 10 MG tablet Take 1 tablet (10 mg total) by mouth every 6 (six) hours as needed (nausea/headache). 01/19/13   Loren Racer, MD  mometasone (NASONEX) 50 MCG/ACT nasal spray Place 2 sprays into the nose daily. 01/19/13   Loren Racer, MD  oxymetazoline (AFRIN NASAL SPRAY) 0.05 % nasal spray Place 2 sprays into the nose 2 (two) times daily. 01/19/13   Loren Racer, MD   BP 139/81 mmHg  Pulse 78  Temp(Src) 98.3 F (36.8 C) (Oral)  Resp 16  Ht  (1.651 m)  Wt 210 lb (95.255 kg)  BMI 34.95 kg/m2  SpO2 100% Physical Exam  Constitutional: She is oriented to person, place, and time. She appears well-developed and well-nourished.  HENT:  Head: Normocephalic and atraumatic.  Eyes: EOM are normal.  Neck: Normal range of motion.  Cardiovascular: Normal rate.   Pulmonary/Chest: Effort normal. No respiratory distress.  Abdominal: Soft.  Musculoskeletal: Normal range of motion.  Entire right leg is minimally larger than left.  No thigh tenderness + posterior popliteal and calf tenderness.  2+ distal pulses 5/5 strength bilaterally  Neurological: She is alert and oriented to person, place, and time.  Ambulatory with limping gait  Skin: Skin is warm and dry.  Psychiatric: She has  a normal mood and affect.  Nursing note and vitals reviewed.  ED Course  Procedures  DIAGNOSTIC STUDIES: Oxygen Saturation is 100% on RA, normal by my interpretation.    COORDINATION OF CARE: 4:13 PM Discussed next steps with pt. She verbalized understanding and is agreeable with the plan.   Labs Review Labs Reviewed - No data to display  Imaging Review No results found. I have personally reviewed and evaluated these images as part of my medical decision-making.   EKG  Interpretation None      MDM   Final diagnoses:  Right leg pain    Will obtain vascular US to r/o DVT. Toradol given for pain.  6:21 PM Vascular tech [facility]   6:42 PM vascular tech here performing US.   US negative for DVT. Suspect msk pain 2/2 pt's job and body habitus. Rx given for naproxen. Encouraged ROM exercises/stretches, heating compresses. Resource guide given to establish PCP for f/u. ER return precautions given.   I personally performed the services described in this documentation, which was scribed in my presence. The recorded information has been reviewed and is accurate.    Carlene CoriaSerena Y Paiton Fosco, PA-C 07/12/15 1527  Laurence Spatesachel Morgan Little, MD 07/13/15 [phone]

## 2015-07-28 ENCOUNTER — Encounter (HOSPITAL_COMMUNITY): Payer: Self-pay | Admitting: Emergency Medicine

## 2015-07-28 ENCOUNTER — Ambulatory Visit (HOSPITAL_COMMUNITY)
Admission: EM | Admit: 2015-07-28 | Discharge: 2015-07-28 | Disposition: A | Discharge status: Home / Self Care | Payer: Self-pay | Attending: Orthopedic Surgery | Admitting: Orthopedic Surgery

## 2015-07-28 DIAGNOSIS — K529 Noninfective gastroenteritis and colitis, unspecified: Secondary | ICD-10-CM

## 2015-07-28 LAB — POCT URINALYSIS DIP (DEVICE)
BILIRUBIN URINE: NEGATIVE
Glucose, UA: NEGATIVE mg/dL
KETONES UR: NEGATIVE mg/dL
NITRITE: NEGATIVE
PH: 6 (ref 5.0–8.0)
Protein, ur: NEGATIVE mg/dL
Specific Gravity, Urine: 1.01 (ref 1.005–1.030)
Urobilinogen, UA: 0.2 mg/dL (ref 0.0–1.0)

## 2015-07-28 LAB — POCT PREGNANCY, URINE: PREG TEST UR: NEGATIVE

## 2015-07-28 MED ORDER — ONDANSETRON HCL 4 MG PO TABS: 4.0000 mg | ORAL_TABLET | Freq: Four times a day (QID) | ORAL | Status: DC

## 2015-07-28 NOTE — ED Notes (Signed)
Patient c/o nausea and vomiting onset this afternoon around 1 pm. Patient reports later on she had diarrhea which has gradually gotten worse. Patient also reports about 1 month ago she began having leg pain and difficulty walking. She was seen and treated in the ER but symptoms have since returned. Patient is in NAD.

## 2015-07-29 NOTE — ED Provider Notes (Signed)
CSN: [id number]     Arrival date & time 07/28/15  1743 History   First MD Initiated Contact with Patient 07/28/15 2002     Chief Complaint  Patient presents with  . Nausea  . Diarrhea  . Leg Pain   (Consider location/radiation/quality/duration/timing/severity/associated sxs/prior Treatment) HPI History obtained from patient:  Pt presents with the cc of: d,n,v Duration of symptoms:2 days Treatment prior to arrival: fluids Context:sudden onset, awoke feeling ill from sleep Other symptoms include: mild headache, back pain, frequent urinationi Pain score:2 FAMILY HISTORY: no history of cancer SOCIAL HISTORY: non smoker, no etoh  History reviewed. No pertinent past medical history. History reviewed. No pertinent past surgical history. History reviewed. No pertinent family history. Social History  Substance Use Topics  . Smoking status: Never Smoker   . Smokeless tobacco: None  . Alcohol Use: No   OB History    No data available     Review of Systems ROS +'ve nausea, vomiting and diarrhea  Denies: HEADACHE, NAUSEA, ABDOMINAL PAIN, CHEST PAIN, CONGESTION, DYSURIA, SHORTNESS OF BREATH  Allergies  Review of patient's allergies indicates no known allergies.  Home Medications   Prior to Admission medications   Medication Sig Start Date End Date Taking? Authorizing Provider  acetaminophen (TYLENOL) 325 MG tablet Take 650 mg by mouth every 6 (six) hours as needed for pain.    Yes Historical Provider, MD  Chlorphen-Phenyleph-ASA (ALKA-SELTZER PLUS COLD PO) Take 1 tablet by mouth every 6 (six) hours as needed (headache or pain).    Historical Provider, MD  ibuprofen (ADVIL,MOTRIN) 600 MG tablet Take 1 tablet (600 mg total) by mouth every 6 (six) hours as needed for pain. 01/19/13   Loren Raceravid Yelverton, MD  metoCLOPramide (REGLAN) 10 MG tablet Take 1 tablet (10 mg total) by mouth every 6 (six) hours as needed (nausea/headache). 01/19/13   Loren Raceravid Yelverton, MD  mometasone (NASONEX) 50  MCG/ACT nasal spray Place 2 sprays into the nose daily. 01/19/13   Loren Raceravid Yelverton, MD  naproxen (NAPROSYN) 500 MG tablet Take 1 tablet (500 mg total) by mouth 2 (two) times daily. 07/11/15   Ace GinsSerena Y Sam, PA-C  ondansetron (ZOFRAN) 4 MG tablet Take 1 tablet (4 mg total) by mouth every 6 (six) hours. 07/28/15   Tharon AquasFrank C Monti Villers, PA  oxymetazoline (AFRIN NASAL SPRAY) 0.05 % nasal spray Place 2 sprays into the nose 2 (two) times daily. 01/19/13   Loren Raceravid Yelverton, MD   Meds Ordered and Administered this Visit  Medications - No data to display  BP 124/75 mmHg  Pulse 65  Temp(Src) 98.7 F (37.1 C) (Oral)  Resp 16  SpO2 100% No data found.   Physical Exam NURSES NOTES AND VITAL SIGNS REVIEWED. CONSTITUTIONAL: Well developed, well nourished, no acute distress HEENT: normocephalic, atraumatic EYES: Conjunctiva normal NECK:normal ROM, supple, no adenopathy PULMONARY:No respiratory distress, normal effort ABDOMINAL: Soft, ND, NT BS+, No CVAT MUSCULOSKELETAL: Normal ROM of all extremities,  SKIN: warm and dry without rash PSYCHIATRIC: Mood and affect, behavior are normal   ED Course  Procedures (including critical care time)  Labs Review Labs Reviewed  POCT URINALYSIS DIP (DEVICE) - Abnormal; Notable for the following:    Hgb urine dipstick SMALL (*)    Leukocytes, UA SMALL (*)    All other components within normal limits  POCT PREGNANCY, URINE   I HAVE PERSONALLY REVIEWED TESTS RESULTS WITH PATIENT.  Imaging Review No results found.   Visual Acuity Review  Right Eye Distance:   Left Eye Distance:  Bilateral Distance:    Right Eye Near:   Left Eye Near:    Bilateral Near:       RX Zofran  MDM   1. Gastroenteritis     Patient is reassured that there are no issues that require transfer to higher level of care at this time or additional tests. Patient is advised to continue home symptomatic treatment. Patient is advised that if there are new or worsening symptoms to  attend the emergency department, contact primary care provider, or return to UC. Instructions of care provided discharged home in stable condition.    THIS NOTE WAS GENERATED USING A VOICE RECOGNITION SOFTWARE PROGRAM. ALL REASONABLE EFFORTS  WERE MADE TO PROOFREAD THIS DOCUMENT FOR ACCURACY.  I have verbally reviewed the discharge instructions with the patient. A printed AVS was given to the patient.  All questions were answered prior to discharge.      Tharon Aquas, PA 07/29/15 1008

## 2016-08-24 ENCOUNTER — Encounter (HOSPITAL_COMMUNITY): Payer: Self-pay | Admitting: Emergency Medicine

## 2016-08-24 ENCOUNTER — Emergency Department (HOSPITAL_COMMUNITY): Payer: Self-pay

## 2016-08-24 ENCOUNTER — Emergency Department (HOSPITAL_COMMUNITY)
Admission: EM | Admit: 2016-08-24 | Discharge: 2016-08-24 | Disposition: A | Discharge status: Home / Self Care | Payer: Self-pay | Attending: Emergency Medicine | Admitting: Emergency Medicine

## 2016-08-24 DIAGNOSIS — Z79899 Other long term (current) drug therapy: Secondary | ICD-10-CM | POA: Insufficient documentation

## 2016-08-24 DIAGNOSIS — R0789 Other chest pain: Secondary | ICD-10-CM | POA: Insufficient documentation

## 2016-08-24 DIAGNOSIS — J209 Acute bronchitis, unspecified: Secondary | ICD-10-CM | POA: Insufficient documentation

## 2016-08-24 DIAGNOSIS — Z791 Long term (current) use of non-steroidal anti-inflammatories (NSAID): Secondary | ICD-10-CM | POA: Insufficient documentation

## 2016-08-24 HISTORY — DX: Pure hypercholesterolemia, unspecified: E78.00

## 2016-08-24 LAB — CBC WITH DIFFERENTIAL/PLATELET
BASOS ABS: 0 10*3/uL (ref 0.0–0.1)
Basophils Relative: 0 %
EOS PCT: 6 %
Eosinophils Absolute: 0.4 10*3/uL (ref 0.0–0.7)
HCT: 40.3 % (ref 36.0–46.0)
Hemoglobin: 13.9 g/dL (ref 12.0–15.0)
LYMPHS ABS: 1.2 10*3/uL (ref 0.7–4.0)
LYMPHS PCT: 19 %
MCH: 30.2 pg (ref 26.0–34.0)
MCHC: 34.5 g/dL (ref 30.0–36.0)
MCV: 87.4 fL (ref 78.0–100.0)
MONO ABS: 0.4 10*3/uL (ref 0.1–1.0)
Monocytes Relative: 5 %
Neutro Abs: 4.5 10*3/uL (ref 1.7–7.7)
Neutrophils Relative %: 70 %
PLATELETS: 240 10*3/uL (ref 150–400)
RBC: 4.61 MIL/uL (ref 3.87–5.11)
RDW: 13.4 % (ref 11.5–15.5)
WBC: 6.5 10*3/uL (ref 4.0–10.5)

## 2016-08-24 LAB — BASIC METABOLIC PANEL
Anion gap: 7 (ref 5–15)
BUN: 14 mg/dL (ref 6–20)
CALCIUM: 8.9 mg/dL (ref 8.9–10.3)
CO2: 27 mmol/L (ref 22–32)
CREATININE: 0.7 mg/dL (ref 0.44–1.00)
Chloride: 106 mmol/L (ref 101–111)
GFR calc non Af Amer: >60 mL/min (ref >60)
Glucose, Bld: 131 mg/dL — ABNORMAL HIGH (ref 65–99)
Potassium: 3.8 mmol/L (ref 3.5–5.1)
Sodium: 140 mmol/L (ref 135–145)

## 2016-08-24 LAB — I-STAT TROPONIN, ED: Troponin i, poc: 0 ng/mL (ref 0.00–0.08)

## 2016-08-24 MED ORDER — ACETAMINOPHEN 325 MG PO TABS
650.0000 mg | ORAL_TABLET | Freq: Once | ORAL | Status: AC
  Administered 2016-08-24: 650 mg via ORAL
  Filled 2016-08-24: qty 2

## 2016-08-24 MED ORDER — ALBUTEROL SULFATE (2.5 MG/3ML) 0.083% IN NEBU
2.5000 mg | INHALATION_SOLUTION | Freq: Once | RESPIRATORY_TRACT | Status: AC
  Administered 2016-08-24: 2.5 mg via RESPIRATORY_TRACT
  Filled 2016-08-24: qty 3

## 2016-08-24 MED ORDER — ALBUTEROL SULFATE HFA 108 (90 BASE) MCG/ACT IN AERS
2.0000 | INHALATION_SPRAY | Freq: Once | RESPIRATORY_TRACT | Status: AC
  Administered 2016-08-24: 2 via RESPIRATORY_TRACT
  Filled 2016-08-24: qty 6.7

## 2016-08-24 MED ORDER — KETOROLAC TROMETHAMINE 30 MG/ML IJ SOLN
30.0000 mg | Freq: Once | INTRAMUSCULAR | Status: AC
  Administered 2016-08-24: 30 mg via INTRAVENOUS
  Filled 2016-08-24: qty 1

## 2016-08-24 MED ORDER — ASPIRIN 81 MG PO CHEW
324.0000 mg | CHEWABLE_TABLET | Freq: Once | ORAL | Status: AC
  Administered 2016-08-24: 324 mg via ORAL
  Filled 2016-08-24: qty 4

## 2016-08-24 NOTE — ED Provider Notes (Signed)
[facility]-EMERGENCY DEPT Provider Note   CSN: [id number] Arrival date & time: 08/24/16  1018     History   Chief Complaint Chief Complaint  Patient presents with  . Chest Pain  . Cough  . Joint Pain    HPI Katherine Trujillo is a 41 y.o. female.  HPI  41 year old female with a history of hyperlipidemia presents with chest pain. She's been having cough, sore throat, congestion/rhinorrhea, headache, chest pain, and body aches for about 4 days. She states the chest pain significant worse last night. There is associated shortness of breath. It feels like a tightness in her chest. Shortness of breath and pain are worse with lying flat. Both are improved with sitting up. I did probe and also partially relieves her pain. She felt like she had a fever yesterday but did not take her temperature. No leg swelling or pain. No smoking history.  Past Medical History:  Diagnosis Date  . High cholesterol     There are no active problems to display for this patient.   History reviewed. No pertinent surgical history.  OB History    No data available       Home Medications    Prior to Admission medications   Medication Sig Start Date End Date Taking? Authorizing Provider  Chlorphen-Phenyleph-ASA (ALKA-SELTZER PLUS COLD PO) Take 1 tablet by mouth every 6 (six) hours as needed (headache or pain).   Yes [provider]  ibuprofen (ADVIL,MOTRIN) 600 MG tablet Take 1 tablet (600 mg total) by mouth every 6 (six) hours as needed for pain. 01/19/13  Yes Loren Racer, MD    Family History No family history on file.  Social History Social History  Substance Use Topics  . Smoking status: Never Smoker  . Smokeless tobacco: Never Used  . Alcohol use No     Allergies   Patient has no known allergies.   Review of Systems Review of Systems  Constitutional: Positive for fever.  HENT: Positive for congestion, rhinorrhea and sore throat.   Respiratory: Positive for cough, chest  tightness and shortness of breath.   Cardiovascular: Positive for chest pain. Negative for leg swelling.  Gastrointestinal: Negative for abdominal pain and vomiting.  Musculoskeletal: Positive for arthralgias.  All other systems reviewed and are negative.    Physical Exam Updated Vital Signs BP 118/74   Pulse 70   Temp 98.2 F (36.8 C)   Resp 14   Ht 5\' 4"  (1.626 m)   Wt 90.7 kg (200 lb)   SpO2 100%   BMI 34.33 kg/m   Physical Exam  Constitutional: She is oriented to person, place, and time. She appears well-developed and well-nourished.  HENT:  Head: Normocephalic and atraumatic.  Right Ear: External ear normal.  Left Ear: External ear normal.  Nose: Nose normal.  Mouth/Throat: No oropharyngeal exudate.  Mild oropharyngeal erythema  Eyes: Right eye exhibits no discharge. Left eye exhibits no discharge.  Neck: Neck supple.  Cardiovascular: Normal rate, regular rhythm and normal heart sounds.   Pulses:      Radial pulses are 2+ on the right side, and 2+ on the left side.  Pulmonary/Chest: Effort normal. No accessory muscle usage. No tachypnea. She has wheezes (diffuse, expiratory). She exhibits tenderness.    Abdominal: Soft. There is no tenderness.  Neurological: She is alert and oriented to person, place, and time.  Skin: Skin is warm and dry.  Nursing note and vitals reviewed.    ED Treatments / Results  Labs (all labs  ordered are listed, but only abnormal results are displayed) Labs Reviewed  BASIC METABOLIC PANEL - Abnormal; Notable for the following:       Result Value   Glucose, Bld 131 (*)    All other components within normal limits  CBC WITH DIFFERENTIAL/PLATELET  Rosezena SensorI-STAT TROPOININ, ED    EKG  EKG Interpretation  Date/Time:  Friday Aug 24 2016 10:25:34 EDT Ventricular Rate:  76 PR Interval:    QRS Duration: 81 QT Interval:  378 QTC Calculation: 425 R Axis:   25 Text Interpretation:  Sinus rhythm Low voltage, precordial leads Probable  anteroseptal infarct, old No old tracing to compare Confirmed by Pricilla LovelessGoldston, Honey Zakarian [phone]) on 08/24/2016 10:58:37 AM       Radiology Dg Chest 2 View  Result Date: 08/24/2016 CLINICAL DATA:  Chest pain and shortness of breath.  Cough. EXAM: CHEST  2 VIEW COMPARISON:  01/19/2013 FINDINGS: The heart size and mediastinal contours are within normal limits. Both lungs are clear. The visualized skeletal structures are unremarkable. IMPRESSION: Normal chest. Electronically Signed   By: Francene BoyersJames  Maxwell M.D.   On: 08/24/2016 11:17    Procedures Procedures (including critical care time)    EMERGENCY DEPARTMENT US CARDIAC EXAM "Study: Limited Ultrasound of the Heart and Pericardium"  INDICATIONS:Chest pain and Dyspnea Multiple views of the heart and pericardium were obtained in real-time with a multi-frequency probe.  PERFORMED UE:AVWUJWBY:Myself IMAGES ARCHIVED?: Yes LIMITATIONS:  Body habitus VIEWS USED: Subcostal 4 chamber and Parasternal long axis INTERPRETATION: Cardiac activity present, Pericardial effusioin absent, Cardiac tamponade absent and Normal contractility   Medications Ordered in ED Medications  albuterol (PROVENTIL) (2.5 MG/3ML) 0.083% nebulizer solution 2.5 mg (2.5 mg Nebulization Given 08/24/16 1128)  aspirin chewable tablet 324 mg (324 mg Oral Given 08/24/16 1122)  ketorolac (TORADOL) 30 MG/ML injection 30 mg (30 mg Intravenous Given 08/24/16 1309)  acetaminophen (TYLENOL) tablet 650 mg (650 mg Oral Given 08/24/16 1309)  albuterol (PROVENTIL HFA;VENTOLIN HFA) 108 (90 Base) MCG/ACT inhaler 2 puff (2 puffs Inhalation Given 08/24/16 1309)     Initial Impression / Assessment and Plan / ED Course  I have reviewed the triage vital signs and the nursing notes.  Pertinent labs & imaging results that were available during my care of the patient were reviewed by me and considered in my medical decision making (see chart for details).     Chest pain and dyspnea resolved with albuterol. This  presentation is most consistent with acute bronchitis. She is currently afebrile and no lobar pneumonia noticed. I think this is most likely a viral process. Low voltage on EKG is probably more due to body habitus. Bedside ultrasound shows no significant or obvious pericardial effusion. Pericarditis is in the differential, continue NSAIDs. No troponin elevation to be consistent with myocarditis. Albuterol as needed, NSAIDs, Tylenol, and supportive care for what is likely a viral process. Strict return precautions.  Final Clinical Impressions(s) / ED Diagnoses   Final diagnoses:  Acute bronchitis, unspecified organism  Chest tightness    New Prescriptions Discharge Medication List as of 08/24/2016  1:38 PM       Pricilla LovelessGoldston, Kimmie Doren, MD 08/24/16 1526

## 2016-08-24 NOTE — ED Triage Notes (Signed)
Patient c/o chest pain, cough that is productive and joint pain for couple days. Patient states that the other day her chest felt so tight while she was at work she was sent home since she was having trouble breathing.  Patient also c/o headache at this time. Patient reports that chest pain is worse with coughing.

## 2017-04-30 ENCOUNTER — Encounter (HOSPITAL_COMMUNITY): Payer: Self-pay | Admitting: Emergency Medicine

## 2017-04-30 ENCOUNTER — Other Ambulatory Visit: Payer: Self-pay

## 2017-04-30 DIAGNOSIS — R05 Cough: Secondary | ICD-10-CM | POA: Diagnosis not present

## 2017-04-30 DIAGNOSIS — Z207 Contact with and (suspected) exposure to pediculosis, acariasis and other infestations: Secondary | ICD-10-CM | POA: Insufficient documentation

## 2017-04-30 DIAGNOSIS — J069 Acute upper respiratory infection, unspecified: Secondary | ICD-10-CM | POA: Diagnosis not present

## 2017-04-30 DIAGNOSIS — R51 Headache: Secondary | ICD-10-CM | POA: Insufficient documentation

## 2017-04-30 DIAGNOSIS — R509 Fever, unspecified: Secondary | ICD-10-CM | POA: Diagnosis present

## 2017-04-30 LAB — BASIC METABOLIC PANEL
Anion gap: 7 (ref 5–15)
BUN: 20 mg/dL (ref 6–20)
CHLORIDE: 106 mmol/L (ref 101–111)
CO2: 22 mmol/L (ref 22–32)
Calcium: 8.5 mg/dL — ABNORMAL LOW (ref 8.9–10.3)
Creatinine, Ser: 0.61 mg/dL (ref 0.44–1.00)
GFR calc Af Amer: >60 mL/min (ref >60)
GLUCOSE: 170 mg/dL — AB (ref 65–99)
POTASSIUM: 3.6 mmol/L (ref 3.5–5.1)
Sodium: 135 mmol/L (ref 135–145)

## 2017-04-30 LAB — CBC WITH DIFFERENTIAL/PLATELET
BASOS ABS: 0 10*3/uL (ref 0.0–0.1)
Basophils Relative: 0 %
EOS PCT: 4 %
Eosinophils Absolute: 0.3 10*3/uL (ref 0.0–0.7)
HCT: 40 % (ref 36.0–46.0)
HEMOGLOBIN: 13.8 g/dL (ref 12.0–15.0)
LYMPHS PCT: 27 %
Lymphs Abs: 2.6 10*3/uL (ref 0.7–4.0)
MCH: 30.5 pg (ref 26.0–34.0)
MCHC: 34.5 g/dL (ref 30.0–36.0)
MCV: 88.3 fL (ref 78.0–100.0)
Monocytes Absolute: 0.6 10*3/uL (ref 0.1–1.0)
Monocytes Relative: 6 %
NEUTROS PCT: 63 %
Neutro Abs: 6.1 10*3/uL (ref 1.7–7.7)
PLATELETS: 233 10*3/uL (ref 150–400)
RBC: 4.53 MIL/uL (ref 3.87–5.11)
RDW: 13.5 % (ref 11.5–15.5)
WBC: 9.7 10*3/uL (ref 4.0–10.5)

## 2017-04-30 NOTE — ED Triage Notes (Signed)
Pt arriving from home with complaint of flu like symptoms and headache present since Saturday. Pt also has productive cough which has been present x3 days. Pt reports nausea but denies vomiting or diarrhea at this time. Pt A&O x4 and ambulatory. No fever. Pt took ibuprofen for headache around 4pm.

## 2017-05-01 ENCOUNTER — Emergency Department (HOSPITAL_COMMUNITY): Payer: Managed Care, Other (non HMO)

## 2017-05-01 ENCOUNTER — Emergency Department (HOSPITAL_COMMUNITY)
Admission: EM | Admit: 2017-05-01 | Discharge: 2017-05-01 | Disposition: A | Discharge status: Home / Self Care | Payer: Managed Care, Other (non HMO) | Attending: Emergency Medicine | Admitting: Emergency Medicine

## 2017-05-01 DIAGNOSIS — J069 Acute upper respiratory infection, unspecified: Secondary | ICD-10-CM

## 2017-05-01 MED ORDER — GUAIFENESIN ER 600 MG PO TB12
600.0000 mg | ORAL_TABLET | Freq: Once | ORAL | Status: AC
  Administered 2017-05-01: 600 mg via ORAL
  Filled 2017-05-01: qty 1

## 2017-05-01 MED ORDER — BENZONATATE 100 MG PO CAPS: 100.0000 mg | ORAL_CAPSULE | Freq: Three times a day (TID) | ORAL | 0 refills | Status: DC | PRN

## 2017-05-01 MED ORDER — ALBUTEROL SULFATE HFA 108 (90 BASE) MCG/ACT IN AERS: 2.0000 | INHALATION_SPRAY | RESPIRATORY_TRACT | 0 refills | Status: DC | PRN

## 2017-05-01 MED ORDER — ALBUTEROL SULFATE (2.5 MG/3ML) 0.083% IN NEBU
5.0000 mg | INHALATION_SOLUTION | Freq: Once | RESPIRATORY_TRACT | Status: AC
  Administered 2017-05-01: 5 mg via RESPIRATORY_TRACT
  Filled 2017-05-01: qty 6

## 2017-05-01 MED ORDER — NAPROXEN 500 MG PO TABS
500.0000 mg | ORAL_TABLET | Freq: Once | ORAL | Status: AC
  Administered 2017-05-01: 500 mg via ORAL
  Filled 2017-05-01: qty 1

## 2017-05-01 NOTE — Discharge Instructions (Signed)
You may alternate Tylenol 1000 mg every 6 hours as needed for fever and pain and ibuprofen 800 mg every 8 hours as needed for fever and pain. Please rest and drink plenty of fluids. This is a viral illness causing your symptoms. You do not need antibiotics for a virus. You may use over-the-counter nasal saline spray and Afrin nasal saline spray as needed for nasal congestion. Please do not use Afrin for more than 3 days in a row. You may use Mucinex and Dextromethorphan as needed for cough.  You may use lozenges and Chloraseptic spray to help with sore throat.  Warm salt water gargles can also help with sore throat.  You may use over-the-counter Unisom (doxyalamine) or Benadryl to help with sleep.  Please note that some combination medicines such as DayQuil and NyQuil have multiple medications in them.  Please make sure you look at all labels to ensure that you are not taking too much of any one particular medication.  Symptoms from a virus may take 7-14 days to run its course. ° °We do not test for the flu from the emergency department as we do not have rapid flu swabs and it takes hours for this test to come back and it would not change our management. The flu is treated like any other virus with supportive measures as listed above. At this time you are outside the treatment window for Tamiflu. Tamiflu has to be taken within the first 48 hours of symptoms.  Tamiflu has many side effects including nausea, vomiting and diarrhea. ° °

## 2017-05-01 NOTE — ED Provider Notes (Signed)
TIME SEEN: 2:00 AM   CHIEF COMPLAINT: Headache, cough, runny nose, subjective fever.  HPI: Patient is a 42 year old female with history of hyperlipidemia who presents to the emergency department with 3 days of subjective fevers, productive cough, pain with coughing, runny nose, diffuse throbbing headache.  Has been around multiple sick contacts with similar symptoms.  No vomiting or diarrhea.  No chest pain or shortness of breath.  Did not have an influenza vaccination this year.  No recent travel.  ROS: See HPI Constitutional: Subjective fever  Eyes: no drainage  ENT:  runny nose   Cardiovascular:  no chest pain  Resp: no SOB  GI: no vomiting GU: no dysuria Integumentary: no rash  Allergy: no hives  Musculoskeletal: no leg swelling  Neurological: no slurred speech ROS otherwise negative  PAST MEDICAL HISTORY/PAST SURGICAL HISTORY:  Past Medical History:  Diagnosis Date  . High cholesterol     MEDICATIONS:  Prior to Admission medications   Medication Sig Start Date End Date Taking? Authorizing Provider  aspirin-acetaminophen-caffeine (EXCEDRIN MIGRAINE) [phone] MG tablet Take 1 tablet by mouth every 6 (six) hours as needed for headache.   Yes [provider]  ibuprofen (ADVIL,MOTRIN) 200 MG tablet Take 400 mg by mouth every 6 (six) hours as needed for moderate pain.   Yes [provider]  Pseudoeph-Doxylamine-DM-APAP (NYQUIL PO) Take 1 capsule by mouth at bedtime as needed (cold).   Yes [provider]  Pseudoephedrine-APAP-DM (DAYQUIL PO) Take 1 capsule by mouth as needed (cold).   Yes [provider]  ibuprofen (ADVIL,MOTRIN) 600 MG tablet Take 1 tablet (600 mg total) by mouth every 6 (six) hours as needed for pain. Patient not taking: Reported on 05/01/2017 01/19/13   Loren RacerYelverton, David, MD    ALLERGIES:  No Known Allergies  SOCIAL HISTORY:  Social History   Tobacco Use  . Smoking status: Never Smoker  . Smokeless tobacco: Never  Used  Substance Use Topics  . Alcohol use: No    FAMILY HISTORY: No family history on file.  EXAM: BP (!) 141/87   Pulse 80   Temp 98.2 F (36.8 C) (Oral)   Resp 15   Ht 5\' 4"  (1.626 m)   Wt 90.7 kg (200 lb)   LMP  (LMP Unknown) Comment: pt only has spotting due to implant  SpO2 99%   BMI 34.33 kg/m  CONSTITUTIONAL: Alert and oriented and responds appropriately to questions. Well-appearing; well-nourished HEAD: Normocephalic EYES: Conjunctivae clear, pupils appear equal, EOMI ENT: normal nose; moist mucous membranes NECK: Supple, no meningismus, no nuchal rigidity, no LAD  CARD: RRR; S1 and S2 appreciated; no murmurs, no clicks, no rubs, no gallops RESP: Normal chest excursion without splinting or tachypnea; breath sounds clear and equal bilaterally; no wheezes, patient does have intermittent rhonchi that clear with coughing, no rales, no hypoxia or respiratory distress, speaking full sentences ABD/GI: Normal bowel sounds; non-distended; soft, non-tender, no rebound, no guarding, no peritoneal signs, no hepatosplenomegaly BACK:  The back appears normal and is non-tender to palpation, there is no CVA tenderness EXT: Normal ROM in all joints; non-tender to palpation; no edema; normal capillary refill; no cyanosis, no calf tenderness or swelling    SKIN: Normal color for age and race; warm; no rash NEURO: Moves all extremities equally PSYCH: The patient's mood and manner are appropriate. Grooming and personal hygiene are appropriate.  MEDICAL DECISION MAKING: Patient here with complaints of likely viral URI symptoms, possible influenza.  Outside treatment window for Tamiflu.  Well-appearing  on exam, nontoxic.  Labs obtained in triage are unremarkable.  Will obtain chest x-ray to evaluate for possible pneumonia.  Does not appear volume overloaded.  Does not appear septic.  We will treat symptomatically with guaifenesin, naproxen and albuterol nebulizer treatment.  ED PROGRESS: Patient  reports feeling much better.  Able to drink here without difficulty.  Chest x-ray clear.  Discussed with patient that I suspect this is a viral illness.  I do not feel she needs antibiotics.  I feel she is safe for discharge.  Discussed return precautions and supportive care instructions at home.  Recommended alternating Tylenol and Motrin and continuing over-the-counter Mucinex.  Will discharge with albuterol inhaler and Tessalon Perles for symptomatic relief at home.  Given outpatient PCP follow-up information.   At this time, I do not feel there is any life-threatening condition present. I have reviewed and discussed all results (EKG, imaging, lab, urine as appropriate) and exam findings with patient/family. I have reviewed nursing notes and appropriate previous records.  I feel the patient is safe to be discharged home without further emergent workup and can continue workup as an outpatient as needed. Discussed usual and customary return precautions. Patient/family verbalize understanding and are comfortable with this plan.  Outpatient follow-up has been provided if needed. All questions have been answered.      Tru Leopard, Layla Maw, DO 05/01/17 [phone]

## 2018-03-31 ENCOUNTER — Emergency Department (HOSPITAL_COMMUNITY)
Admission: EM | Admit: 2018-03-31 | Discharge: 2018-03-31 | Disposition: A | Discharge status: Home / Self Care | Payer: Managed Care, Other (non HMO) | Attending: Emergency Medicine | Admitting: Emergency Medicine

## 2018-03-31 ENCOUNTER — Encounter (HOSPITAL_COMMUNITY): Payer: Self-pay | Admitting: *Deleted

## 2018-03-31 DIAGNOSIS — Z79899 Other long term (current) drug therapy: Secondary | ICD-10-CM | POA: Diagnosis not present

## 2018-03-31 DIAGNOSIS — Z7982 Long term (current) use of aspirin: Secondary | ICD-10-CM | POA: Insufficient documentation

## 2018-03-31 DIAGNOSIS — R062 Wheezing: Secondary | ICD-10-CM | POA: Diagnosis not present

## 2018-03-31 DIAGNOSIS — J111 Influenza due to unidentified influenza virus with other respiratory manifestations: Secondary | ICD-10-CM | POA: Diagnosis not present

## 2018-03-31 DIAGNOSIS — R6889 Other general symptoms and signs: Secondary | ICD-10-CM

## 2018-03-31 DIAGNOSIS — R52 Pain, unspecified: Secondary | ICD-10-CM | POA: Diagnosis present

## 2018-03-31 MED ORDER — AEROCHAMBER PLUS FLO-VU MEDIUM MISC
1.0000 | Freq: Once | Status: AC
  Administered 2018-03-31: 1
  Filled 2018-03-31: qty 1

## 2018-03-31 MED ORDER — ALBUTEROL SULFATE HFA 108 (90 BASE) MCG/ACT IN AERS: 2.0000 | INHALATION_SPRAY | RESPIRATORY_TRACT | 1 refills | Status: DC | PRN

## 2018-03-31 MED ORDER — BENZONATATE 100 MG PO CAPS: 100.0000 mg | ORAL_CAPSULE | Freq: Three times a day (TID) | ORAL | 0 refills | Status: DC | PRN

## 2018-03-31 MED ORDER — ACETAMINOPHEN 500 MG PO TABS
1000.0000 mg | ORAL_TABLET | Freq: Once | ORAL | Status: AC
  Administered 2018-03-31: 1000 mg via ORAL
  Filled 2018-03-31: qty 2

## 2018-03-31 MED ORDER — ALBUTEROL SULFATE HFA 108 (90 BASE) MCG/ACT IN AERS
2.0000 | INHALATION_SPRAY | Freq: Once | RESPIRATORY_TRACT | Status: AC
  Administered 2018-03-31: 2 via RESPIRATORY_TRACT
  Filled 2018-03-31: qty 6.7

## 2018-03-31 MED ORDER — IPRATROPIUM-ALBUTEROL 0.5-2.5 (3) MG/3ML IN SOLN
3.0000 mL | Freq: Once | RESPIRATORY_TRACT | Status: AC
  Administered 2018-03-31: 3 mL via RESPIRATORY_TRACT
  Filled 2018-03-31: qty 3

## 2018-03-31 NOTE — Discharge Instructions (Addendum)
Today your symptoms are consistent with a flu or a flulike illness.  As I did not test you for the flu we call this a flulike illness.  I have given you information to read on the flu as most of this still applies.  If you have not already, please consider getting a flu shot once you are well.  Please make sure to practice good hand hygiene to help prevent the spread of flu.  We discussed today that many symptoms of the flu such as fever, not feeling well, and body aches can also be the first signs of more serious illnesses or infections.  If you have any new or worsening symptoms please seek additional medical care and evaluation.   ° °Please take Ibuprofen (Advil, motrin) and Tylenol (acetaminophen) to relieve your pain.  You may take up to 600 MG (3 pills) of normal strength ibuprofen every 8 hours as needed.  In between doses of ibuprofen you make take tylenol, up to 1,000 mg (two extra strength pills).  Do not take more than 3,000 mg tylenol in a 24 hour period.  Please check all medication labels as many medications such as pain and cold medications may contain tylenol.  Do not drink alcohol while taking these medications.  Do not take other NSAID'S while taking ibuprofen (such as aleve or naproxen).  Please take ibuprofen with food to decrease stomach upset. ° ° °

## 2018-03-31 NOTE — ED Triage Notes (Signed)
Pt complains of generalized body aches and nasal congestion x 2 days. Pt has tried ibuprofen for pain.

## 2018-03-31 NOTE — ED Provider Notes (Signed)
[facility]-EMERGENCY DEPT Provider Note   CSN: [id number] Arrival date & time: 03/31/18  1352     History   Chief Complaint Chief Complaint  Patient presents with  . Generalized Body Aches  . Nasal Congestion    HPI Katherine Trujillo is a 42 y.o. female who presents today for two days of body aches, nasal congestion, fever.  She took ibuprofen which helped for a few hours.   She has also been taking advil congestion but that makes her feel tired.   She did not get a flu shot this year.  One pm was last time any antipyretic.   HPI  Past Medical History:  Diagnosis Date  . High cholesterol     There are no active problems to display for this patient.   History reviewed. No pertinent surgical history.   OB History   No obstetric history on file.      Home Medications    Prior to Admission medications   Medication Sig Start Date End Date Taking? Authorizing Provider  albuterol (PROVENTIL HFA;VENTOLIN HFA) 108 (90 Base) MCG/ACT inhaler Inhale 2 puffs into the lungs every 4 (four) hours as needed for wheezing or shortness of breath. 03/31/18   Cristina GongHammond, Aliayah Tyer W, PA-C  aspirin-acetaminophen-caffeine (EXCEDRIN MIGRAINE) [id number] MG tablet Take 1 tablet by mouth every 6 (six) hours as needed for headache.    [provider]  benzonatate (TESSALON) 100 MG capsule Take 1 capsule (100 mg total) by mouth every 8 (eight) hours as needed for cough. 03/31/18   Cristina GongHammond, Zissy Hamlett W, PA-C  ibuprofen (ADVIL,MOTRIN) 200 MG tablet Take 400 mg by mouth every 6 (six) hours as needed for moderate pain.    [provider]  ibuprofen (ADVIL,MOTRIN) 600 MG tablet Take 1 tablet (600 mg total) by mouth every 6 (six) hours as needed for pain. Patient not taking: Reported on 05/01/2017 01/19/13   Loren RacerYelverton, David, MD  Pseudoeph-Doxylamine-DM-APAP (NYQUIL PO) Take 1 capsule by mouth at bedtime as needed (cold).    [provider]    Pseudoephedrine-APAP-DM (DAYQUIL PO) Take 1 capsule by mouth as needed (cold).    [provider]    Family History No family history on file.  Social History Social History   Tobacco Use  . Smoking status: Never Smoker  . Smokeless tobacco: Never Used  Substance Use Topics  . Alcohol use: No  . Drug use: No     Allergies   Patient has no known allergies.   Review of Systems Review of Systems  Constitutional: Positive for chills, fatigue and fever.  HENT: Positive for congestion, postnasal drip, rhinorrhea and sore throat. Negative for ear pain, sinus pressure, sinus pain and trouble swallowing.   Eyes: Negative for visual disturbance.  Respiratory: Positive for cough. Negative for chest tightness and shortness of breath.   Cardiovascular: Positive for chest pain (Occasional, after coughing started).  Gastrointestinal: Negative for abdominal pain, diarrhea, nausea and vomiting.  Genitourinary: Negative for dysuria.  Musculoskeletal: Negative for back pain and neck pain.  Skin: Negative for rash.  Neurological: Negative for weakness and headaches.  Psychiatric/Behavioral: Negative for confusion.  All other systems reviewed and are negative.    Physical Exam Updated Vital Signs BP 124/78 (BP Location: Right Arm)   Pulse 89   Temp 98.3 F (36.8 C) (Oral)   Resp 18   SpO2 97%   Physical Exam Vitals signs and nursing note reviewed.  Constitutional:      General: She is  not in acute distress.    Appearance: She is well-developed. She is not diaphoretic.  HENT:     Head: Normocephalic and atraumatic.     Right Ear: Tympanic membrane, ear canal and external ear normal.     Left Ear: Tympanic membrane, ear canal and external ear normal.     Nose: Mucosal edema, congestion and rhinorrhea present.     Mouth/Throat:     Mouth: Mucous membranes are moist.     Pharynx: Uvula midline. No oropharyngeal exudate.     Tonsils: No tonsillar exudate.  Eyes:      General: No scleral icterus.    Conjunctiva/sclera: Conjunctivae normal.  Neck:     Musculoskeletal: Normal range of motion and neck supple. No neck rigidity.  Cardiovascular:     Rate and Rhythm: Normal rate and regular rhythm.     Heart sounds: Normal heart sounds.  Pulmonary:     Effort: Pulmonary effort is normal. No respiratory distress.     Breath sounds: Wheezing (Diffuse, bilaterally. ) present.  Abdominal:     Tenderness: There is no abdominal tenderness.  Lymphadenopathy:     Cervical: No cervical adenopathy.  Skin:    General: Skin is warm and dry.  Neurological:     General: No focal deficit present.     Mental Status: She is alert.  Psychiatric:        Mood and Affect: Mood normal.        Behavior: Behavior normal.      ED Treatments / Results  Labs (all labs ordered are listed, but only abnormal results are displayed) Labs Reviewed - No data to display  EKG None  Radiology No results found.  Procedures Procedures (including critical care time)  Medications Ordered in ED Medications  ipratropium-albuterol (DUONEB) 0.5-2.5 (3) MG/3ML nebulizer solution 3 mL (3 mLs Nebulization Given 03/31/18 1837)  acetaminophen (TYLENOL) tablet 1,000 mg (1,000 mg Oral Given 03/31/18 1836)  albuterol (PROVENTIL HFA;VENTOLIN HFA) 108 (90 Base) MCG/ACT inhaler 2 puff (2 puffs Inhalation Given 03/31/18 1932)  AEROCHAMBER PLUS FLO-VU MEDIUM MISC 1 each (1 each Other Given 03/31/18 1931)     Initial Impression / Assessment and Plan / ED Course  I have reviewed the triage vital signs and the nursing notes.  Pertinent labs & imaging results that were available during my care of the patient were reviewed by me and considered in my medical decision making (see chart for details).  Clinical Course as of Apr 01 1931  Mon Mar 31, 2018  1826 Temp 100.4   [EH]  4098 Patient reevaluated, lungs are clear to auscultation bilaterally.  She reports that she feels like she is  breathing better.   [EH]    Clinical Course User Index [EH] Cristina Gong, PA-C   Patient with symptoms consistent with influenza.  Vitals are stable, low-grade fever.  Patient had mild diffuse bilateral wheezing on initial exam.  She was treated with DuoNeb treatment, after which they fully resolved.  Given full resolution and likely diagnosis of flu x-ray was not ordered.  Chart review shows that she has had similar symptoms with wheezing in the past when she has been sick.  Due to patient's presentation and physical exam a chest x-ray was not ordered bc likely diagnosis of flu.  Discussed the cost versus benefit of Tamiflu treatment with the patient.  The patient understands that symptoms are greater than the recommended 24-48 hour window of treatment.  Patient will be discharged with instructions  to orally hydrate, rest, and use over-the-counter medications such as anti-inflammatories ibuprofen and Aleve for muscle aches and Tylenol for fever.  Patient will also be given a cough suppressant.     Final Clinical Impressions(s) / ED Diagnoses   Final diagnoses:  Flu-like symptoms  Wheezing    ED Discharge Orders         Ordered    benzonatate (TESSALON) 100 MG capsule  Every 8 hours PRN     03/31/18 1926    albuterol (PROVENTIL HFA;VENTOLIN HFA) 108 (90 Base) MCG/ACT inhaler  Every 4 hours PRN     03/31/18 1926           Norman ClayHammond, Elisa Kutner W, PA-C 03/31/18 1933    Azalia Bilisampos, Kevin, MD 03/31/18 2342

## 2020-10-13 ENCOUNTER — Other Ambulatory Visit: Payer: Self-pay | Admitting: Emergency Medicine

## 2020-10-13 DIAGNOSIS — R519 Headache, unspecified: Secondary | ICD-10-CM

## 2020-10-13 DIAGNOSIS — H9191 Unspecified hearing loss, right ear: Secondary | ICD-10-CM

## 2020-10-19 ENCOUNTER — Other Ambulatory Visit: Payer: Self-pay | Admitting: Physician Assistant

## 2020-10-19 DIAGNOSIS — H9191 Unspecified hearing loss, right ear: Secondary | ICD-10-CM

## 2020-10-19 DIAGNOSIS — R519 Headache, unspecified: Secondary | ICD-10-CM

## 2020-10-25 ENCOUNTER — Ambulatory Visit
Admission: RE | Admit: 2020-10-25 | Discharge: 2020-10-25 | Disposition: A | Discharge status: Home / Self Care | Payer: Managed Care, Other (non HMO) | Source: Ambulatory Visit | Attending: Emergency Medicine | Admitting: Emergency Medicine

## 2020-10-25 ENCOUNTER — Other Ambulatory Visit: Payer: Self-pay

## 2020-10-25 DIAGNOSIS — R519 Headache, unspecified: Secondary | ICD-10-CM

## 2020-10-25 DIAGNOSIS — H9191 Unspecified hearing loss, right ear: Secondary | ICD-10-CM

## 2021-04-26 ENCOUNTER — Encounter: Payer: Self-pay | Admitting: Gastroenterology

## 2021-05-03 ENCOUNTER — Other Ambulatory Visit: Payer: Self-pay

## 2021-05-03 ENCOUNTER — Ambulatory Visit (AMBULATORY_SURGERY_CENTER): Payer: Managed Care, Other (non HMO) | Admitting: *Deleted

## 2021-05-03 VITALS — Ht 64.0 in | Wt 212.0 lb

## 2021-05-03 DIAGNOSIS — Z1211 Encounter for screening for malignant neoplasm of colon: Secondary | ICD-10-CM

## 2021-05-03 NOTE — Progress Notes (Signed)
Patient's pre-visit was done today over the phone with the patient. Name,DOB and address verified. Patient denies any allergies to Eggs and Soy. Patient denies any past surgeries. Patient is not taking any diet pills or blood thinners. No home Oxygen.   Prep instructions mail to pt-pt aware. Patient understands to call us back with any questions or concerns. Patient is aware of our care-partner policy and 0000000 safety protocol.   The patient is COVID-19 vaccinated.

## 2021-05-10 ENCOUNTER — Encounter: Payer: Self-pay | Admitting: Gastroenterology

## 2021-05-15 ENCOUNTER — Ambulatory Visit (AMBULATORY_SURGERY_CENTER): Payer: Managed Care, Other (non HMO) | Admitting: Gastroenterology

## 2021-05-15 ENCOUNTER — Encounter: Payer: Self-pay | Admitting: Gastroenterology

## 2021-05-15 VITALS — BP 111/90 | HR 61 | Temp 98.4°F | Resp 15 | Ht 64.0 in | Wt 212.0 lb

## 2021-05-15 DIAGNOSIS — Z1211 Encounter for screening for malignant neoplasm of colon: Secondary | ICD-10-CM

## 2021-05-15 MED ORDER — SODIUM CHLORIDE 0.9 % IV SOLN: 500.0000 mL | Freq: Once | INTRAVENOUS | Status: DC

## 2021-05-15 NOTE — Progress Notes (Signed)
To pacu, VSS. Report to [email or web address] 

## 2021-05-15 NOTE — Op Note (Signed)
[facility] Patient Name: Katherine Trujillo Procedure Date: 05/15/2021 10:50 AM MRN: [id number] Endoscopist: Rachael Fee , MD Age: 46 Referring MD:  Date of Birth: 04/11/1975 Gender: Female Account #: [id number] Procedure:                Colonoscopy Indications:              Screening for colorectal malignant neoplasm Medicines:                Monitored Anesthesia Care Procedure:                Pre-Anesthesia Assessment:                           - Prior to the procedure, a History and Physical                            was performed, and patient medications and                            allergies were reviewed. The patient's tolerance of                            previous anesthesia was also reviewed. The risks                            and benefits of the procedure and the sedation                            options and risks were discussed with the patient.                            All questions were answered, and informed consent                            was obtained. Prior Anticoagulants: The patient has                            taken no previous anticoagulant or antiplatelet                            agents. ASA Grade Assessment: II - A patient with                            mild systemic disease. After reviewing the risks                            and benefits, the patient was deemed in                            satisfactory condition to undergo the procedure.                           After obtaining informed consent, the colonoscope  was passed under direct vision. Throughout the                            procedure, the patient's blood pressure, pulse, and                            oxygen saturations were monitored continuously. The                            CF [id number] #[id number] was introduced through the anus                            and advanced to the the cecum, identified by                            appendiceal orifice  and ileocecal valve. The                            colonoscopy was performed without difficulty. The                            patient tolerated the procedure well. The quality                            of the bowel preparation was good. The ileocecal                            valve, appendiceal orifice, and rectum were                            photographed. Scope In: 10:55:49 AM Scope Out: 11:11:00 AM Scope Withdrawal Time: 0 hours 9 minutes 45 seconds  Total Procedure Duration: 0 hours 15 minutes 11 seconds  Findings:                 The entire examined colon appeared normal on direct                            and retroflexion views. Complications:            No immediate complications. Estimated blood loss:                            None. Estimated Blood Loss:     Estimated blood loss: none. Impression:               - The entire examined colon is normal on direct and                            retroflexion views.                           - No polyps or cancers. Recommendation:           - Patient has a contact number available for  emergencies. The signs and symptoms of potential                            delayed complications were discussed with the                            patient. Return to normal activities tomorrow.                            Written discharge instructions were provided to the                            patient.                           - Resume previous diet.                           - Continue present medications.                           - Repeat colonoscopy in 10 years for screening. Rachael Fee, MD 05/15/2021 11:12:56 AM This report has been signed electronically.

## 2021-05-15 NOTE — Progress Notes (Signed)
HPI: This is a woman at routine risk for CRC   ROS: complete GI ROS as described in HPI, all other review negative.  Constitutional:  No unintentional weight loss   Past Medical History:  Diagnosis Date   High cholesterol    Hypertension     Past Surgical History:  Procedure Laterality Date   NO PAST SURGERIES      Current Outpatient Medications  Medication Sig Dispense Refill   aspirin-acetaminophen-caffeine (EXCEDRIN MIGRAINE) 250-250-65 MG tablet Take 1 tablet by mouth every 6 (six) hours as needed for headache.     hydrochlorothiazide (HYDRODIURIL) 12.5 MG tablet Take 12.5 mg by mouth daily.     Current Facility-Administered Medications  Medication Dose Route Frequency Provider Last Rate Last Admin   0.9 %  sodium chloride infusion  500 mL Intravenous Once Rachael Fee, MD        Allergies as of 05/15/2021   (No Known Allergies)    Family History  Problem Relation Age of Onset   Colon cancer Maternal Grandmother     Social History   Socioeconomic History   Marital status: Single    Spouse name: Not on file   Number of children: Not on file   Years of education: Not on file   Highest education level: Not on file  Occupational History   Not on file  Tobacco Use   Smoking status: Never   Smokeless tobacco: Never  Vaping Use   Vaping Use: Never used  Substance and Sexual Activity   Alcohol use: No   Drug use: No   Sexual activity: Yes    Birth control/protection: Implant  Other Topics Concern   Not on file  Social History Narrative   Not on file   Social Determinants of Health   Financial Resource Strain: Not on file  Food Insecurity: Not on file  Transportation Needs: Not on file  Physical Activity: Not on file  Stress: Not on file  Social Connections: Not on file  Intimate Partner Violence: Not on file     Physical Exam: BP (!) 145/88    Pulse 73    Temp 98.4 F (36.9 C)    Ht 5\' 4"  (1.626 m)    Wt 212 lb (96.2 kg)    SpO2 99%    BMI  36.39 kg/m  Constitutional: generally well-appearing Psychiatric: alert and oriented x3 Lungs: CTA bilaterally Heart: no MCR  Assessment and plan: 46 y.o. female with routine risk for CRC  Screening colonosc opy today  Care is appropriate for the ambulatory setting.  54, MD  Gastroenterology 05/15/2021, 10:50 AM

## 2021-05-15 NOTE — Patient Instructions (Signed)
Resume previous diet and medications. ° °Repeat colonoscopy in 10 years. ° ° °YOU HAD AN ENDOSCOPIC PROCEDURE TODAY AT THE [facility]:   Refer to the procedure report that was given to you for any specific questions about what was found during the examination.  If the procedure report does not answer your questions, please call your gastroenterologist to clarify.  If you requested that your care partner not be given the details of your procedure findings, then the procedure report has been included in a sealed envelope for you to review at your convenience later. ° °YOU SHOULD EXPECT: Some feelings of bloating in the abdomen. Passage of more gas than usual.  Walking can help get rid of the air that was put into your GI tract during the procedure and reduce the bloating. If you had a lower endoscopy (such as a colonoscopy or flexible sigmoidoscopy) you may notice spotting of blood in your stool or on the toilet paper. If you underwent a bowel prep for your procedure, you may not have a normal bowel movement for a few days. ° °Please Note:  You might notice some irritation and congestion in your nose or some drainage.  This is from the oxygen used during your procedure.  There is no need for concern and it should clear up in a day or so. ° °SYMPTOMS TO REPORT IMMEDIATELY: ° °Following lower endoscopy (colonoscopy or flexible sigmoidoscopy): ° Excessive amounts of blood in the stool ° Significant tenderness or worsening of abdominal pains ° Swelling of the abdomen that is new, acute ° Fever of 100°F or higher ° ° °For urgent or emergent issues, a gastroenterologist can be reached at any hour by calling [phone]. °Do not use MyChart messaging for urgent concerns.  ° ° °DIET:  We do recommend a small meal at first, but then you may proceed to your regular diet.  Drink plenty of fluids but you should avoid alcoholic beverages for 24 hours. ° °ACTIVITY:  You should plan to take it easy for the rest of  today and you should NOT DRIVE or use heavy machinery until tomorrow (because of the sedation medicines used during the test).   ° °FOLLOW UP: °Our staff will call the number listed on your records 48-72 hours following your procedure to check on you and address any questions or concerns that you may have regarding the information given to you following your procedure. If we do not reach you, we will leave a message.  We will attempt to reach you two times.  During this call, we will ask if you have developed any symptoms of COVID 19. If you develop any symptoms (ie: fever, flu-like symptoms, shortness of breath, cough etc.) before then, please call [phone].  If you test positive for Covid 19 in the 2 weeks post procedure, please call and report this information to us.   ° °If any biopsies were taken you will be contacted by phone or by letter within the next 1-3 weeks.  Please call us at [phone] if you have not heard about the biopsies in 3 weeks.  ° ° °SIGNATURES/CONFIDENTIALITY: °You and/or your care partner have signed paperwork which will be entered into your electronic medical record.  These signatures attest to the fact that that the information above on your After Visit Summary has been reviewed and is understood.  Full responsibility of the confidentiality of this discharge information lies with you and/or your care-partner.  °

## 2021-05-15 NOTE — Progress Notes (Signed)
Vitals by Courtney. 

## 2021-05-17 ENCOUNTER — Telehealth: Payer: Self-pay

## 2021-05-17 NOTE — Telephone Encounter (Signed)
Called [phone] and left a message we tried to reach pt for a follow up call. maw

## 2021-05-17 NOTE — Telephone Encounter (Signed)
°  Follow up Call-  Call back number 05/15/2021  Post procedure Call Back phone  # [phone]  Permission to leave phone message Yes  Some recent data might be hidden    2nd follow up call made.  NALM

## 2021-06-07 ENCOUNTER — Ambulatory Visit: Payer: Managed Care, Other (non HMO) | Admitting: Registered"

## 2021-06-07 ENCOUNTER — Other Ambulatory Visit: Payer: Self-pay | Admitting: Physician Assistant

## 2021-06-07 DIAGNOSIS — Z1231 Encounter for screening mammogram for malignant neoplasm of breast: Secondary | ICD-10-CM

## 2021-06-08 ENCOUNTER — Ambulatory Visit: Payer: Managed Care, Other (non HMO)

## 2021-06-15 ENCOUNTER — Ambulatory Visit: Payer: Managed Care, Other (non HMO)

## 2021-06-22 ENCOUNTER — Ambulatory Visit: Payer: Managed Care, Other (non HMO)

## 2021-07-22 ENCOUNTER — Encounter (HOSPITAL_BASED_OUTPATIENT_CLINIC_OR_DEPARTMENT_OTHER): Payer: Self-pay

## 2021-07-22 DIAGNOSIS — R5383 Other fatigue: Secondary | ICD-10-CM

## 2021-07-22 DIAGNOSIS — R0683 Snoring: Secondary | ICD-10-CM

## 2021-07-22 DIAGNOSIS — G4733 Obstructive sleep apnea (adult) (pediatric): Secondary | ICD-10-CM

## 2021-09-04 ENCOUNTER — Ambulatory Visit: Payer: Managed Care, Other (non HMO) | Admitting: Dietician

## 2022-08-19 ENCOUNTER — Other Ambulatory Visit: Payer: Self-pay

## 2022-08-19 ENCOUNTER — Encounter (HOSPITAL_BASED_OUTPATIENT_CLINIC_OR_DEPARTMENT_OTHER): Payer: Self-pay | Admitting: Emergency Medicine

## 2022-08-19 ENCOUNTER — Emergency Department (HOSPITAL_BASED_OUTPATIENT_CLINIC_OR_DEPARTMENT_OTHER)
Admission: EM | Admit: 2022-08-19 | Discharge: 2022-08-19 | Disposition: A | Discharge status: Home / Self Care | Payer: Managed Care, Other (non HMO) | Attending: Emergency Medicine | Admitting: Emergency Medicine

## 2022-08-19 DIAGNOSIS — R519 Headache, unspecified: Secondary | ICD-10-CM | POA: Diagnosis present

## 2022-08-19 DIAGNOSIS — Z20822 Contact with and (suspected) exposure to covid-19: Secondary | ICD-10-CM | POA: Diagnosis not present

## 2022-08-19 DIAGNOSIS — J0110 Acute frontal sinusitis, unspecified: Secondary | ICD-10-CM | POA: Diagnosis not present

## 2022-08-19 DIAGNOSIS — I10 Essential (primary) hypertension: Secondary | ICD-10-CM | POA: Diagnosis not present

## 2022-08-19 DIAGNOSIS — Z79899 Other long term (current) drug therapy: Secondary | ICD-10-CM | POA: Insufficient documentation

## 2022-08-19 LAB — RESP PANEL BY RT-PCR (RSV, FLU A&B, COVID)  RVPGX2
Influenza A by PCR: NEGATIVE
Influenza B by PCR: NEGATIVE
Resp Syncytial Virus by PCR: NEGATIVE
SARS Coronavirus 2 by RT PCR: NEGATIVE

## 2022-08-19 LAB — GROUP A STREP BY PCR: Group A Strep by PCR: NOT DETECTED

## 2022-08-19 MED ORDER — OXYMETAZOLINE HCL 0.05 % NA SOLN
1.0000 | Freq: Once | NASAL | Status: AC
  Administered 2022-08-19: 1 via NASAL
  Filled 2022-08-19: qty 30

## 2022-08-19 MED ORDER — AMOXICILLIN-POT CLAVULANATE 875-125 MG PO TABS: 1.0000 | ORAL_TABLET | Freq: Two times a day (BID) | ORAL | 0 refills | Status: AC

## 2022-08-19 NOTE — ED Provider Notes (Signed)
  [facility] [facility] Provider Note   CSN: [id number] Arrival date & time: 08/19/22  1904     History {Add pertinent medical, surgical, social history, OB history to HPI:1} Chief Complaint  Patient presents with   Multiple Complaints    Katherine Trujillo is a 47 y.o. female.  Since yesterday       Home Medications Prior to Admission medications   Medication Sig Start Date End Date Taking? Authorizing Provider  aspirin-acetaminophen-caffeine (EXCEDRIN MIGRAINE) [phone] MG tablet Take 1 tablet by mouth every 6 (six) hours as needed for headache.    [provider]  hydrochlorothiazide (HYDRODIURIL) 12.5 MG tablet Take 12.5 mg by mouth daily. 04/21/21   [provider]      Allergies    Patient has no known allergies.    Review of Systems   Review of Systems  Physical Exam Updated Vital Signs BP (!) 120/96   Pulse 62   Temp (!) 97.4 F (36.3 C)   Resp 16   Ht 5\' 4"  (1.626 m)   Wt 94.8 kg   SpO2 99%   BMI 35.87 kg/m  Physical Exam  ED Results / Procedures / Treatments   Labs (all labs ordered are listed, but only abnormal results are displayed) Labs Reviewed  RESP PANEL BY RT-PCR (RSV, FLU A&B, COVID)  RVPGX2  GROUP A STREP BY PCR    EKG None  Radiology No results found.  Procedures Procedures  {Document cardiac monitor, telemetry assessment procedure when appropriate:1}  Medications Ordered in ED Medications - No data to display  ED Course/ Medical Decision Making/ A&P   {   Click here for ABCD2, HEART and other calculatorsREFRESH Note before signing :1}                          Medical Decision Making  ***  {Document critical care time when appropriate:1} {Document review of labs and clinical decision tools ie heart score, Chads2Vasc2 etc:1}  {Document your independent review of radiology images, and any outside records:1} {Document your discussion with family members, caretakers, and  with consultants:1} {Document social determinants of health affecting pt's care:1} {Document your decision making why or why not admission, treatments were needed:1} Final Clinical Impression(s) / ED Diagnoses Final diagnoses:  None    Rx / DC Orders ED Discharge Orders     None

## 2022-08-19 NOTE — ED Triage Notes (Addendum)
Pt c/o nosebleeds, itchy throat, body aches/muscle pain, HA since yesterday; sts bil hands and feet feel numb and cold; hands and feet are a normal, warm temperature to touch in triage

## 2022-08-19 NOTE — Discharge Instructions (Addendum)
You were seen for your sinus infection and headache in the emergency department.   At home, please take tylenol, ibuprofen, and excedrin as needed for your headache. Perform the sinus rinses for your congestion and use the afrin we have given you.   If your symptoms do not improve within the next 3 days you may take the antibiotic we have prescribed you (Augmentin).  Check your MyChart online for the results of any tests that had not resulted by the time you left the emergency department.   Follow-up with your primary doctor in 2-3 days regarding your visit.    Return immediately to the emergency department if you experience any concerning symptoms.    Thank you for visiting our Emergency Department. It was a pleasure taking care of you today.

## 2022-09-14 IMAGING — MR MR HEAD W/O CM
11 series · 48 of 48 positions shown · non-contrast
Comparison: No pertinent prior exams available for comparison.

CLINICAL DATA: Intractable headache, unspecified chronicity
pattern, unspecified headache type LMO.4 (VVX-EL-CM). Decreased
hearing, right XAZ.AZ (VVX-EL-CM). Additional history provided by
scanning technologist: Patient reports right-sided head/face
migraines, decreasing hearing, nausea, vision issues, difficulty
walking for 4 months.

EXAM:
MRI HEAD WITHOUT CONTRAST
TECHNIQUE: Multiplanar, multiecho pulse sequences of the brain and surrounding
structures were obtained without intravenous contrast.

[Series 2: T1 · sagittal · 5.0mm · 0.49mm/px · 2 of 24 slices shown]
[im 1/24]
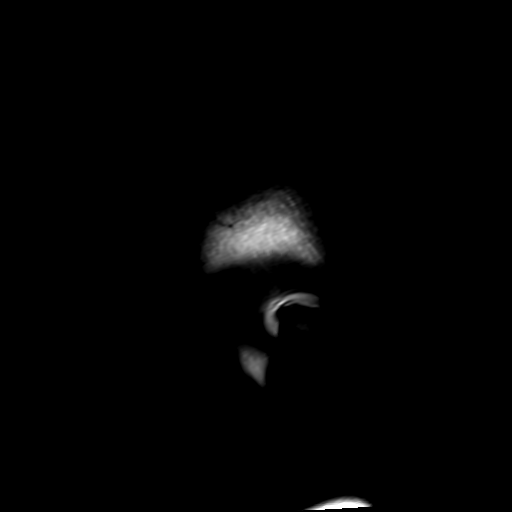
[im 24/24]
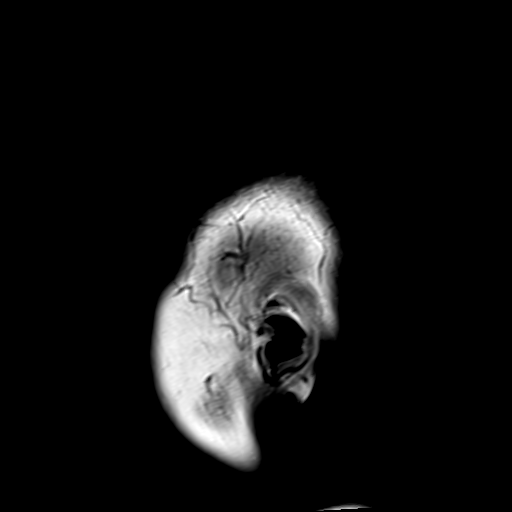

[Series 3: ax ep2d_diff_3 · axial · 3.0mm · 1.88mm/px · z∈[-77,+84]mm · 8 of 110 slices shown]
[im 1/110]
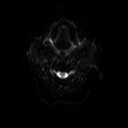
[im 16/110]
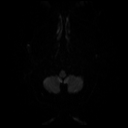
[im 32/110]
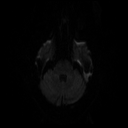
[im 47/110]
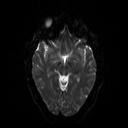
[im 63/110]
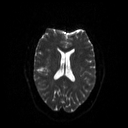
[im 78/110]
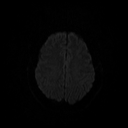
[im 94/110]
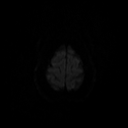
[im 110/110]
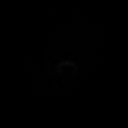

[Series 4: ax ep2d_diff_3_adc · axial · 3.0mm · 1.88mm/px · z∈[-77,+84]mm · 4 of 55 slices shown]
[im 1/55]
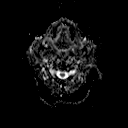
[im 19/55]
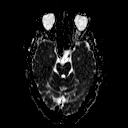
[im 37/55]
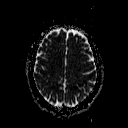
[im 55/55]
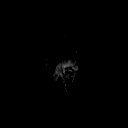

[Series 5: cor ep2d_diff · coronal · 5.0mm · 1.77mm/px · 4 of 58 slices shown]
[im 1/58]
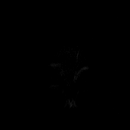
[im 20/58]
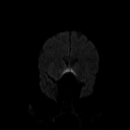
[im 39/58]
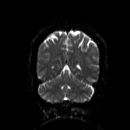
[im 58/58]
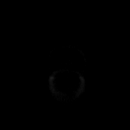

[Series 6: cor ep2d_diff_adc · coronal · 5.0mm · 1.77mm/px · 2 of 29 slices shown]
[im 1/29]
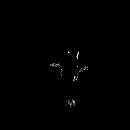
[im 29/29]
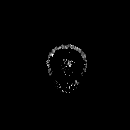

[Series 8: swi_images · axial · 2.0mm · 0.94mm/px · z∈[-75,+81]mm · 6 of 80 slices shown]
[im 1/80]
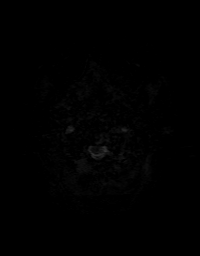
[im 16/80]
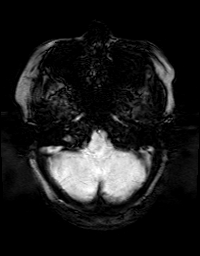
[im 32/80]
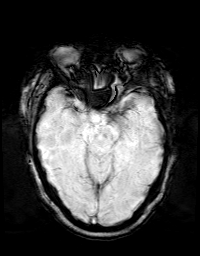
[im 48/80]
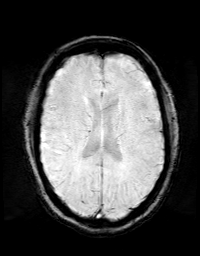
[im 64/80]
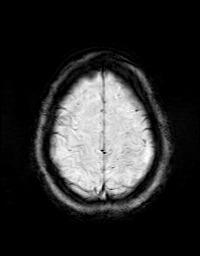
[im 80/80]
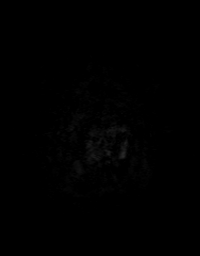

[Series 9: FLAIR · axial · 3.0mm · 0.49mm/px · z∈[-73,+78]mm · 3 of 40 slices shown]
[im 1/40]
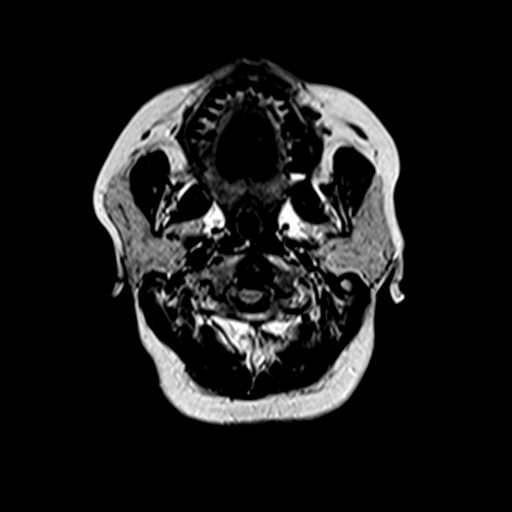
[im 20/40]
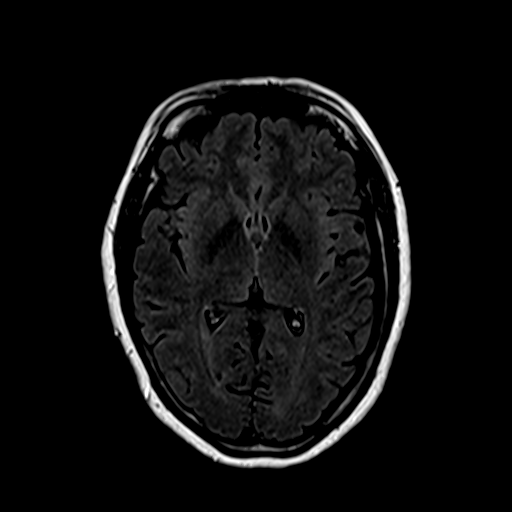
[im 40/40]
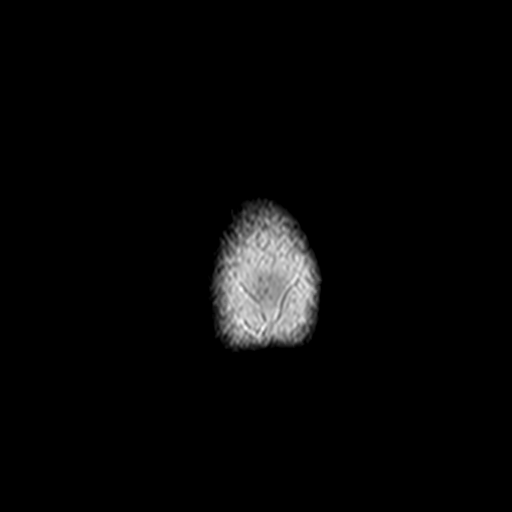

[Series 10: T2 · axial · 5.0mm · 0.75mm/px · z∈[-77,+83]mm · 2 of 28 slices shown (1 of 2)]
[im 1/28]
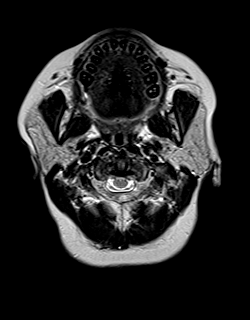
[im 28/28]
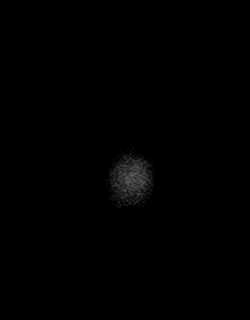

[Series 11: T2 · coronal · 5.0mm · 0.45mm/px · 2 of 29 slices shown (2 of 2)]
[im 1/29]
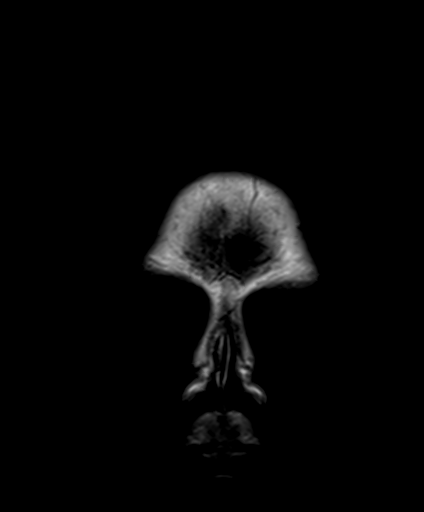
[im 29/29]
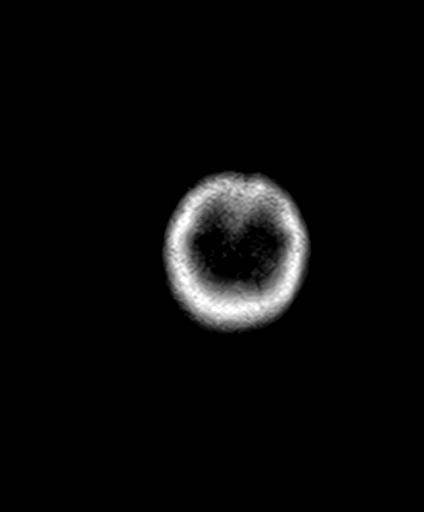

[Series 12: t1_mpr_tra · axial · 1.0mm · 0.75mm/px · z∈[-76,+82]mm · 12 of 160 slices shown]
[im 1/160]
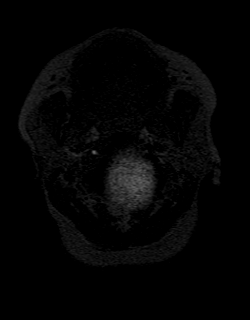
[im 15/160]
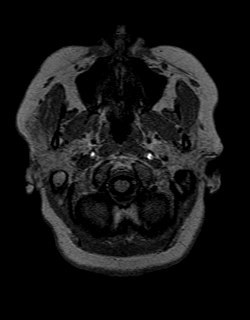
[im 29/160]
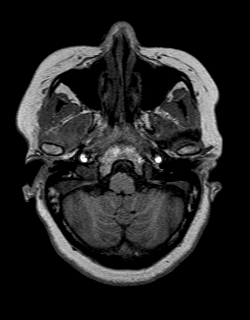
[im 44/160]
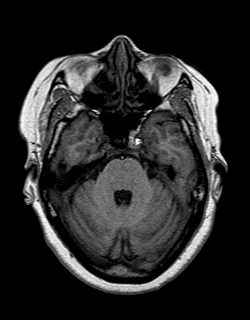
[im 58/160]
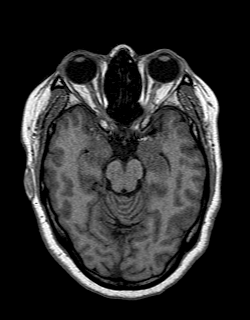
[im 73/160]
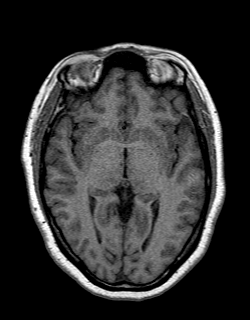
[im 87/160]
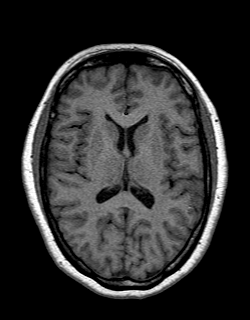
[im 102/160]
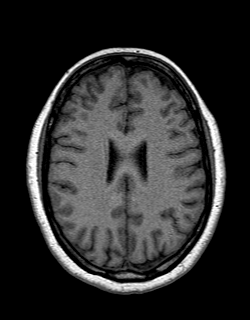
[im 116/160]
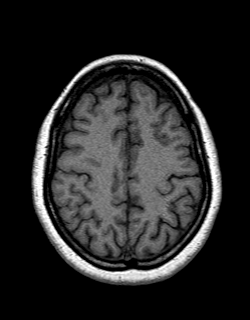
[im 131/160]
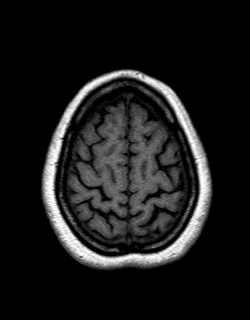
[im 145/160]
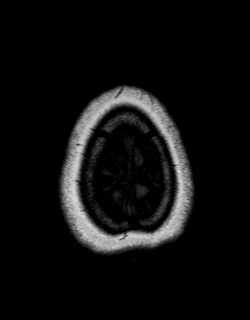
[im 160/160]
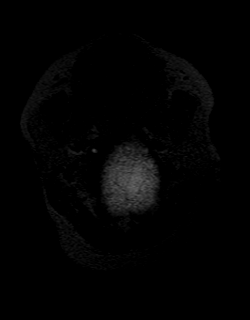

[Series 13: bSSFP · axial · 0.7mm · 0.28mm/px · z∈[-37,-7]mm · 3 of 44 slices shown]
[im 1/44]
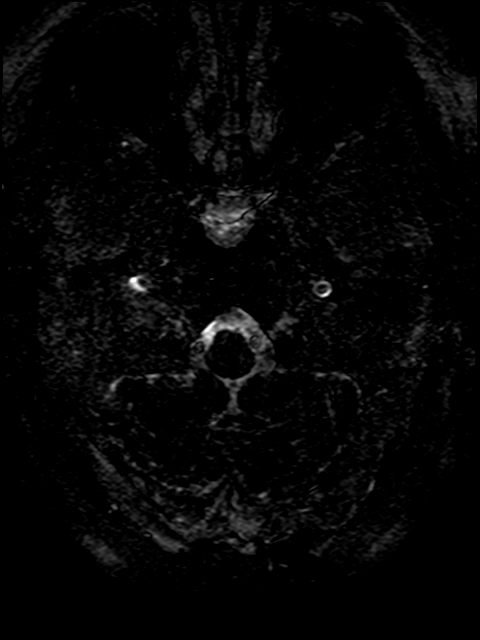
[im 22/44]
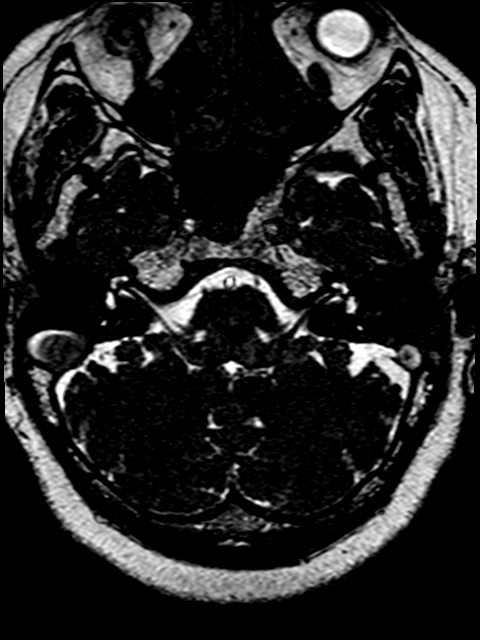
[im 44/44]
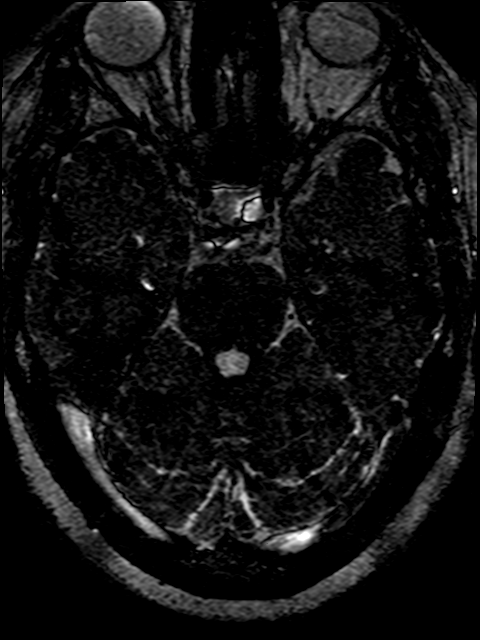

[48 of 48 positions shown; findings below may reference images not displayed]

FINDINGS: Brain:

Cerebral volume is normal.

No cortical encephalomalacia is identified. No significant cerebral
white matter disease.

No evidence of an intracranial mass. Specifically, there is no
evidence of a cerebellopontine angle or internal auditory canal mass
on this non-contrast examination. Unremarkable appearance of the
seventh and eighth cranial nerves bilaterally.

There is no acute infarct.

No chronic intracranial blood products.

No extra-axial fluid collection.

No midline shift.

Vascular: Expected proximal arterial flow voids.

Skull and upper cervical spine: No focal marrow lesion.

Sinuses/Orbits: Visualized orbits show no acute finding. Trace
bilateral ethmoid and maxillary sinus mucosal thickening.
IMPRESSION: No evidence of acute intracranial abnormality.

No evidence of a cerebellopontine angle or internal auditory canal
mass on this non-contrast exam.

Unremarkable non-contrast MRI appearance of the brain.

## 2024-03-17 ENCOUNTER — Other Ambulatory Visit: Payer: Self-pay | Admitting: Physician Assistant

## 2024-03-17 DIAGNOSIS — Z1231 Encounter for screening mammogram for malignant neoplasm of breast: Secondary | ICD-10-CM
